# Patient Record
Sex: Female | Born: 1985 | Hispanic: No | Marital: Married | Smoking: Never smoker
Health system: Southern US, Community
[De-identification: ages and names within clinical notes are randomized; demographics above are authoritative.]

## PROBLEM LIST (undated history)

## (undated) ENCOUNTER — Inpatient Hospital Stay (HOSPITAL_COMMUNITY): Payer: Self-pay

## (undated) DIAGNOSIS — Z789 Other specified health status: Secondary | ICD-10-CM

## (undated) HISTORY — PX: NO PAST SURGERIES: SHX2092

---

## 2015-08-27 NOTE — L&D Delivery Note (Signed)
Delivery Note At 5:32 AM a viable and healthy female was delivered via Vaginal, Spontaneous Delivery (Presentation: Right Occiput Anterior).  APGAR: 9, 9; weight pending  .   Placenta status: Intact, Spontaneous.  Cord: 3 vessels with the following complications: None.    Anesthesia: None  Episiotomy: None Lacerations: 2nd degree;Perineal Suture Repair: 3.0 monocryl Est. Blood Loss (mL):  300  Mom to postpartum.  Baby to Couplet care / Skin to Skin.  Tonya [name] is a 30 y.o. female [phone] with IUP at 842w6d admitted for IOL for postdates.  She progressed quickly after start of Pitocin to complete and pushed less than 5 minutes to deliver.  Cord clamping delayed by several minutes then clamped by CNM and cut by FOB.  Placenta intact and spontaneous, bleeding minimal.  Second degree laceration repaired without difficulty.  Mom and baby stable prior to transfer to postpartum. She plans on breastfeeding.     [name], Tonya Charters 12/31/2015, 5:53 AM

## 2015-09-24 ENCOUNTER — Inpatient Hospital Stay (HOSPITAL_COMMUNITY)
Admission: AD | Admit: 2015-09-24 | Discharge: 2015-09-24 | Disposition: A | Discharge status: Home / Self Care | Payer: Medicaid Other | Source: Ambulatory Visit | Attending: Obstetrics & Gynecology | Admitting: Obstetrics & Gynecology

## 2015-09-24 ENCOUNTER — Encounter (HOSPITAL_COMMUNITY): Payer: Self-pay | Admitting: *Deleted

## 2015-09-24 DIAGNOSIS — R109 Unspecified abdominal pain: Secondary | ICD-10-CM

## 2015-09-24 DIAGNOSIS — O26892 Other specified pregnancy related conditions, second trimester: Secondary | ICD-10-CM | POA: Diagnosis not present

## 2015-09-24 DIAGNOSIS — R1011 Right upper quadrant pain: Secondary | ICD-10-CM | POA: Diagnosis present

## 2015-09-24 DIAGNOSIS — Z3A26 26 weeks gestation of pregnancy: Secondary | ICD-10-CM | POA: Diagnosis not present

## 2015-09-24 DIAGNOSIS — O26899 Other specified pregnancy related conditions, unspecified trimester: Secondary | ICD-10-CM

## 2015-09-24 LAB — COMPREHENSIVE METABOLIC PANEL
ALT: 10 U/L — ABNORMAL LOW (ref 14–54)
AST: 17 U/L (ref 15–41)
Albumin: 2.9 g/dL — ABNORMAL LOW (ref 3.5–5.0)
Alkaline Phosphatase: 76 U/L (ref 38–126)
Anion gap: 7 (ref 5–15)
BILIRUBIN TOTAL: 0.6 mg/dL (ref 0.3–1.2)
BUN: 10 mg/dL (ref 6–20)
CO2: 25 mmol/L (ref 22–32)
Calcium: 8.6 mg/dL — ABNORMAL LOW (ref 8.9–10.3)
Chloride: 105 mmol/L (ref 101–111)
Creatinine, Ser: 0.67 mg/dL (ref 0.44–1.00)
Glucose, Bld: 79 mg/dL (ref 65–99)
POTASSIUM: 3.5 mmol/L (ref 3.5–5.1)
Sodium: 137 mmol/L (ref 135–145)
TOTAL PROTEIN: 6.2 g/dL — AB (ref 6.5–8.1)

## 2015-09-24 LAB — URINALYSIS, ROUTINE W REFLEX MICROSCOPIC
BILIRUBIN URINE: NEGATIVE
Glucose, UA: NEGATIVE mg/dL
Hgb urine dipstick: NEGATIVE
Ketones, ur: 15 mg/dL — AB
LEUKOCYTES UA: NEGATIVE
NITRITE: NEGATIVE
PH: 6 (ref 5.0–8.0)
Protein, ur: NEGATIVE mg/dL
SPECIFIC GRAVITY, URINE: 1.02 (ref 1.005–1.030)

## 2015-09-24 LAB — PROTEIN / CREATININE RATIO, URINE
CREATININE, URINE: 193 mg/dL
Protein Creatinine Ratio: 0.09 mg/mg{Cre} (ref 0.00–0.15)
Total Protein, Urine: 17 mg/dL

## 2015-09-24 LAB — CBC
HEMATOCRIT: 28.6 % — AB (ref 36.0–46.0)
Hemoglobin: 9.5 g/dL — ABNORMAL LOW (ref 12.0–15.0)
MCH: 27.6 pg (ref 26.0–34.0)
MCHC: 33.2 g/dL (ref 30.0–36.0)
MCV: 83.1 fL (ref 78.0–100.0)
Platelets: 199 10*3/uL (ref 150–400)
RBC: 3.44 MIL/uL — ABNORMAL LOW (ref 3.87–5.11)
RDW: 13.3 % (ref 11.5–15.5)
WBC: 4.9 10*3/uL (ref 4.0–10.5)

## 2015-09-24 NOTE — MAU Note (Signed)
Pt arrived in the [place] on the 24th from [place] 5 days ago. Pt reports pain in ride upper quad. For 2-3 weeks. Pt worse since yesterday. Pt states that she is 6 months pregnant and given a due date of May 3rd when she was in her country. Pt desires a pregnancy verification letter today.

## 2015-09-24 NOTE — MAU Provider Note (Signed)
  History     CSN: [id number]  Arrival date and time: 09/24/15 2027   First Provider Initiated Contact with Patient 09/24/15 2153      CC: RUQ pain and itching   HPI  Patient is 30 y.o. [id number] [redacted]w[redacted]d reporting RUQ pain and itching.  Pain began last week, acutely worse last night and today.  Described as deep pain, not sharp in quality.  Also itching on legs, thighs and arms.  Did not have this problem with prior pregnancies.  No h/o HTN or gestational HTN. Denies VB, LOF, CTX.  Endorses good fetal movement. Denies blurry vision, headaches, peripheral edema.   OB History    Gravida Para Term Preterm AB TAB SAB Ectopic Multiple Living   History reviewed. No pertinent past medical history.  Past Surgical History  Procedure Laterality Date  . No past surgeries      No family history on file.  Social History  Substance Use Topics  . Smoking status: Never Smoker   . Smokeless tobacco: None  . Alcohol Use: No    Allergies: No Known Allergies  No prescriptions prior to admission    ROS  ROS General: no fevers, chills Eye: no vision changes HENT: no rhinorrhea, sore throat, ear pain CV: no chest pain, palpitations Lung: no SOB, cough GI: no N/V/D, +RUQ abd pain  GU: no VB, LOF, dysuria Skin: +itching  Neuro: no headache, seizure  Psych: no depression, anxiety   Physical Exam   Blood pressure 109/59, pulse 78, temperature 97.9 F (36.6 C), resp. rate 18, height  (1.6 m), weight 75.116 kg (165 lb 9.6 oz), last menstrual period 03/20/2015.  Physical Exam  General: NAD, well appearing pregnant female  Eye: PERRL, EOMI, no icterus  HENT: [place], AT, normal pharynx  CV: RRR, no murmurs Lung: CTAB GI: gravid, uterus above umbilicus, soft, RUQ ttp  Skin: excoriations on arms BL, no jaundice Neuro: AAOx4, no focal deficits Psych: normal mood/affect    MAU Course  Procedures  #FWB: Category 1 FHR 140s/good variability/+ accels/ no decels,  reactive   U/A clean except +ketones Pr/Cr ratio 0.09  CMP wnl including LFTs  CBC hgb 9.5, ptl wnl     Assessment and Plan  Patient is 30 y.o. [id number] [redacted]w[redacted]d reporting RUQ pain and itching likely secondary to possible cholestasis of pregnancy.  Clinically not HELLP given normal labs above.    - may try benadryl for itching - labor precautions dicussed - fetal kick counts reinforced - Handout given - Follow-up with OB provider - need to establish care   Dispo: discharge home  Wynne Dust, MD, PGY-1    Amber Heckart 09/24/2015, 9:53 PM

## 2015-09-24 NOTE — Discharge Instructions (Signed)

## 2015-10-16 ENCOUNTER — Encounter: Payer: Self-pay | Admitting: Family Medicine

## 2015-10-16 ENCOUNTER — Other Ambulatory Visit (HOSPITAL_COMMUNITY)
Admission: RE | Admit: 2015-10-16 | Discharge: 2015-10-16 | Disposition: A | Discharge status: Home / Self Care | Payer: Medicaid Other | Source: Ambulatory Visit | Attending: Family Medicine | Admitting: Family Medicine

## 2015-10-16 ENCOUNTER — Ambulatory Visit (INDEPENDENT_AMBULATORY_CARE_PROVIDER_SITE_OTHER): Payer: Medicaid Other | Admitting: Family Medicine

## 2015-10-16 VITALS — BP 125/53 | HR 80 | Temp 98.5°F | Wt 162.5 lb

## 2015-10-16 DIAGNOSIS — Z01419 Encounter for gynecological examination (general) (routine) without abnormal findings: Secondary | ICD-10-CM | POA: Diagnosis present

## 2015-10-16 DIAGNOSIS — Z113 Encounter for screening for infections with a predominantly sexual mode of transmission: Secondary | ICD-10-CM

## 2015-10-16 DIAGNOSIS — Z789 Other specified health status: Secondary | ICD-10-CM | POA: Diagnosis not present

## 2015-10-16 DIAGNOSIS — Z3490 Encounter for supervision of normal pregnancy, unspecified, unspecified trimester: Secondary | ICD-10-CM | POA: Insufficient documentation

## 2015-10-16 DIAGNOSIS — Z1151 Encounter for screening for human papillomavirus (HPV): Secondary | ICD-10-CM

## 2015-10-16 DIAGNOSIS — Z23 Encounter for immunization: Secondary | ICD-10-CM | POA: Diagnosis not present

## 2015-10-16 DIAGNOSIS — O0933 Supervision of pregnancy with insufficient antenatal care, third trimester: Secondary | ICD-10-CM

## 2015-10-16 DIAGNOSIS — Z124 Encounter for screening for malignant neoplasm of cervix: Secondary | ICD-10-CM

## 2015-10-16 DIAGNOSIS — Z3493 Encounter for supervision of normal pregnancy, unspecified, third trimester: Secondary | ICD-10-CM | POA: Diagnosis not present

## 2015-10-16 DIAGNOSIS — O0930 Supervision of pregnancy with insufficient antenatal care, unspecified trimester: Secondary | ICD-10-CM | POA: Insufficient documentation

## 2015-10-16 LAB — POCT URINALYSIS DIP (DEVICE)
BILIRUBIN URINE: NEGATIVE
Glucose, UA: NEGATIVE mg/dL
Hgb urine dipstick: NEGATIVE
KETONES UR: NEGATIVE mg/dL
Leukocytes, UA: NEGATIVE
NITRITE: NEGATIVE
PH: 5 (ref 5.0–8.0)
PROTEIN: NEGATIVE mg/dL
Specific Gravity, Urine: 1.025 (ref 1.005–1.030)
Urobilinogen, UA: 0.2 mg/dL (ref 0.0–1.0)

## 2015-10-16 MED ORDER — PRENATAL VITAMINS PLUS 27-1 MG PO TABS: 1.0000 | ORAL_TABLET | Freq: Once | ORAL | Status: DC

## 2015-10-16 NOTE — Progress Notes (Signed)
   Subjective:    Tonya [name] is a [id number] [redacted]w[redacted]d being seen today for her first obstetrical visit.  Her obstetrical history is significant for late to care. Patient does intend to breast feed. Pregnancy history fully reviewed.  Patient reports no complaints.  Filed Vitals:   10/16/15 0918  BP: 125/53  Pulse: 80  Temp: 98.5 F (36.9 C)  Weight: 162 lb 8 oz (73.71 kg)    HISTORY: OB History  Gravida Para Term Preterm AB SAB TAB Ectopic Multiple Living  # Outcome Date GA Lbr Len/2nd Weight Sex Delivery Anes PTL Lv  4 Current           3 Term     M Vag-Spont None N   2 Term     M Vag-Spont None N Y  1 Term     M Vag-Spont None N Y     History reviewed. No pertinent past medical history. Past Surgical History  Procedure Laterality Date  . No past surgeries     History reviewed. No pertinent family history.   Exam    Uterus:   30 wk size  Pelvic Exam:    Perineum: Normal Perineum   Vulva: Bartholin's, Urethra, Skene's normal   Vagina:  normal mucosa, normal discharge   Cervix: multiparous appearance and no bleeding following Pap   Adnexa: normal adnexa and no mass, fullness, tenderness   Bony Pelvis: average  System: Breast:  normal appearance, no masses or tenderness   Skin: normal coloration and turgor, no rashes    Neurologic: normal   Extremities: normal strength, tone, and muscle mass   HEENT extra ocular movement intact and sclera clear, anicteric   Mouth/Teeth mucous membranes moist, pharynx normal without lesions and dental hygiene good   Neck supple   Cardiovascular: regular rate and rhythm, I/VI soft blowing holosystolic murmur   Respiratory:  appears well, vitals normal, no respiratory distress, acyanotic, normal RR, ear and throat exam is normal, neck free of mass or lymphadenopathy, chest clear, no wheezing, crepitations, rhonchi, normal symmetric air entry   Abdomen: soft, non-tender; bowel sounds normal; no masses,  no organomegaly       Assessment/Plan:  1. Supervision of normal pregnancy, third trimester  - Culture, OB Urine - Prenatal Profile - Hemoglobinopathy evaluation - Prescript Monitor Profile(19) - Cytology - PAP - [place] MFM OB COMP + 14 WK; Future - Cystic fibrosis diagnostic study - Glucose Tolerance, 1 HR (50g)  2. Late prenatal care affecting pregnancy, antepartum, third trimester Too late for genetic screen   Renley Gutman S 10/16/2015

## 2015-10-16 NOTE — Patient Instructions (Addendum)
Naas-nuujinta Go'aansiga naaska waa mid ka mid ah xulashada ugu fiican ee aad samayn kartaa adiga iyo ilmahaaga. Isbeddel ku hormoonnada [place] sababa unugyada naaska si aad u koraan iyo kordhiyaa tirada iyo size of tuubada caanaha aad. hormoonnada waxa kale oo u oggolaan borotiinada, sonkorta, iyo dufan ka qulqulka dhiigga in la sameeyo caanaha naaska ee qanjidhada caanaha soo saara. Hormoonnada hortago caanaha naaska ka la sii daayey ka hor inta ilmahaagu dhasho iyo sidoo kale socodka caanaha isla markiiba dhalashada ka dib. Marka naasnuujinta ayaa bilaabay, waana fikirro ilmahaaga, iyo sidoo kale isaga ama iyada caanonuug ama qaylinaya, kicin kara in la sii daayo oo caano ah ka qanjirada caanaha soo saara. [place], Your Baby caanaha Your ugu horeysay (dambar) ka caawisaa habka shaqo dheefshiidka ilmahaaga wanaagsan. Waxaa jira antibodies in caanaha aad ka caawiya ilmahaaga dagaalamo cudurada. Ilmahaagu waxa uu leeyahay dhacdooyinka a hoose ee neefta, xasaasiyad, iyo lama filaan ah dhimashada dhallaanka syndrome. The nafaqooyinka caanaha naaska waa wanaagsan ee ilmahaaga ka qaaciidooyinka dhallaanka iyo waxaa si gaar ah loogu talagalay baahida ilmahaaga. Caanaha naaska hagaajinaysaa [employer]. Ilmahaagu waa yar tahay in ay yeeshaan xaaladaha kale, sida cayilka carruurnimada, neefta, ama nooca 2 mellitus diabetes. Waayo, waxaad Naas-nuujinta waxay ka caawisaa in la abuuro bond aad u gaarka ah ee u dhexeeya adiga iyo ilmahaaga. Naasnuujintu waa ku haboon. Caanaha naaska had iyo jeer waxaa laga heli karaa heerkulka saxda ah iyo [place]. Naas-nuujinta waxay ka caawisaa si ay u gubaan calories oo kaa caawinaysaa in aad lumiso miisaanka helay [place] lagu guda Francoise Ceo. Naas ka dhigaysa heshiiska ilmo galeenka si aad u size prepregnancy ay si dhakhso ah oo loogu talla dhiig (uub) ka dib umusha. Naas-nuujinta ayaa kaa yareyn kara halista nooca  2aad ee diabetes mellitus, osteoporosis, iyo naaska ama kansarka ugxansiduhu dambe ee nolosha. CALAAMADAHA IN [facility] WAA GAAJADA Cal[place]ad[place] Hore ee Gaajada feejignaanta la kordhiyo ama waxqabad. [place]. Movement of madaxa dhinac ilaa dhinac. Movement of madaxa iyo furitaanka afka Luciana Axe of afka ama dhabanka waxaa stroked (afjarno). Kordhinta jaqaan dhawaaqa, dibnaha, muusiqada, taahid, ama xooga. Gacan-ka-afka [id number]. caanonuug korodha faraha ama gacmaha. Calaamadaha dambe ee Gaajada Fussing. Ka dhex qaylinaya. Ba'an [place] Gaajada [place] gaajo xad dhaaf ah u baahan doonaa dejinaaya oo u tacsiyaynaysay hor ilmahaaga awoodaan in ay si guul leh u naasnuujin noqon doonaa. Ha sugin calaamadaha soo socda gaajo xad dhaaf ah ay u dhacaan ka hor inta aadan la bilaabo naas-nuujinta: Degenaansho la'aanta. A cod weyn, qaylada xoogga leh. Qeyliyo. AASAAS NUUJINTA Naas Bilaabayo Raadi meel raaxo leh in fadhiiso ama jiifso, oo aad qoorta iyo dhabarka si fiican u taageeray. Dhig barkimo ama buste u [place] hoos ilmahaaga in ay isaga ama iyada ku soo dejin heerka naaska (haddii aad ka fadhiisanayso). barkimo Nursing waxaa si gaar ah loogu talagalay in lagu caawiyo gacmahaaga iyo ilmahaaga taageero [employer]. Hubi in caloosha ilmaha waxaa soo food saartay calooshaada. Qunyar salaax naasahaaga. Iyadoo farahaaga, massage ka derbiga xabadka aad xagga ibta dhaqaaq wareeg ah. Tani waxay dhiiri socodka caanaha. Waxaa laga yaabaa inaad u baahan tahay in ay sii wadaan tallaabadan lagu jiro quudinta haddii caanaha si tartiib ah u baxayaan. Taageer naaska la 4 faraha hoose iyo suulka aad kor ku ibta. Hubi farahaaga yihiin iyo sidoo kale ka ibta naaska oo afka ilmahaaga iska. Stroke bushimaha ilmahaaga si tartiib ah oo aad farta ama ibta. Marka ilmahaaga afkiisu waa u furan ballaaran ku filan, si deg deg ah si aad u soo qaado ilmahaaga naaska, gelinayn ibta oo idil iyo sida badan ee  aagga midab agagaarka ibta (ibta) intii suurto gal ah ee ilmahaaga afkayga ma gelin. ibta More waa in ay la arki karo ka sareeyaan dibinta sare ee ilmahaaga ka hoos dibinta hoose. carrabka ilmahaagu waa inuu u dhexeeyo ama xanjo iyada hoose iyo naaska. Hubi in ilmahaaga afkiisa si sax ah u taagan hareeraha ibta (shabaqa). bushimihiisa ilmahaaga waa in la abuuro seal a on naaska iyo dibadda loo soo jeestay (everted). Waa caadi in ilmahaaga nuugo oo ku saabsan 2-3 daqiiqo si ay u bilaabaan socodka caanaha naaska. Johney [place] Baridda ilmahaaga sida loo qabsado on inay naaska si sax ah waa mid aad u muhiim ah. xire An khaldan waxay Lanetta Inch xanuun ibta oo hoos u siin caanaha aad iyo miisaanka masaakiinta faa'iido ee ilmahaaga. [place] kale, haddii [employer] rogtid oo aad ibta naaska si sax ah, isaga ama iyada waxaa laga yaabaa in la gooyo qaar ka mid ah hawada lagu jiro quudinta. Murrell Redden waxay ka dhigi kartaa ilmahaagu ooyo. Daacsiinta ilmahaaga marka aad beddelaan naasahaaga inta lagu jiro quudinta ayaa kaa caawin kara in laga takhaluso hawada. Si kastaba ha ahaatee, wax ku barayay in ilmahaagu qabsado on si sax ah weli waa habka ugu fiican si looga hortago jeclayn ka liqidda hawada halka naas nuujinta. Calaamadaha in ilmahaaga ayaa si guul leh shabaqa on inay ibta: tugging Silent ama caanonuug aamusa, oo aan aad u [place] xanuun. Liqidda [employer] u dhaxaysa 3-4 Rogue Jury. dhaqdhaqaaqa Murqo kor ku xusan oo hore ee isaga ama iyada dhegaha halka nuugo. Calaamadaha in ilmahaaga ayaa si guul ah ma shabaqa on in ibta naaska: Nuugista dhawaaqa ama dilin dhawaaqyada ka soo ilmahaaga halka naas nuujinta. xanuunka ibta. Haddii aad u malaynayso in [place] uusan shabaqa ku sax, simbiriirixan fartaada galay geeska of ilmahaaga

## 2015-10-16 NOTE — Progress Notes (Signed)
Nutrition note: 1st visit consult Pt has gained 4.5# @ 30w, which is < expected. Pt reports eating 3 meals & 2-3 snacks/d. Pt is taking a PNV. Pt reports no N&V but has some heartburn. NKFA. Pt received verbal & written education on general nutrition during pregnancy via an interpreter. Encouraged protein foods with all meals & snacks. Discussed wt gain goals of 11-20# or 0.5#/wk. Pt agrees to eat a protein source with each meal & snack. Pt has WIC & plans to BF. F/u as needed Blondell Reveal, MS, RD, LDN, [facility]

## 2015-10-17 ENCOUNTER — Ambulatory Visit (HOSPITAL_COMMUNITY)
Admission: RE | Admit: 2015-10-17 | Discharge: 2015-10-17 | Disposition: A | Discharge status: Home / Self Care | Payer: Medicaid Other | Source: Ambulatory Visit | Attending: Family Medicine | Admitting: Family Medicine

## 2015-10-17 DIAGNOSIS — Z3A30 30 weeks gestation of pregnancy: Secondary | ICD-10-CM | POA: Diagnosis not present

## 2015-10-17 DIAGNOSIS — Z36 Encounter for antenatal screening of mother: Secondary | ICD-10-CM | POA: Diagnosis not present

## 2015-10-17 DIAGNOSIS — Z3493 Encounter for supervision of normal pregnancy, unspecified, third trimester: Secondary | ICD-10-CM

## 2015-10-17 LAB — PRENATAL PROFILE (SOLSTAS)
ANTIBODY SCREEN: NEGATIVE
BASOS ABS: 0 10*3/uL (ref 0.0–0.1)
BASOS PCT: 0 % (ref 0–1)
EOS ABS: 0.2 10*3/uL (ref 0.0–0.7)
Eosinophils Relative: 4 % (ref 0–5)
HEMATOCRIT: 29.6 % — AB (ref 36.0–46.0)
HEMOGLOBIN: 9.9 g/dL — AB (ref 12.0–15.0)
HEP B S AG: NEGATIVE
HIV 1&2 Ab, 4th Generation: NONREACTIVE
Lymphocytes Relative: 17 % (ref 12–46)
Lymphs Abs: 0.9 10*3/uL (ref 0.7–4.0)
MCH: 27 pg (ref 26.0–34.0)
MCHC: 33.4 g/dL (ref 30.0–36.0)
MCV: 80.7 fL (ref 78.0–100.0)
MONOS PCT: 7 % (ref 3–12)
MPV: 11 fL (ref 8.6–12.4)
Monocytes Absolute: 0.4 10*3/uL (ref 0.1–1.0)
NEUTROS ABS: 3.8 10*3/uL (ref 1.7–7.7)
NEUTROS PCT: 72 % (ref 43–77)
Platelets: 241 10*3/uL (ref 150–400)
RBC: 3.67 MIL/uL — AB (ref 3.87–5.11)
RDW: 13.6 % (ref 11.5–15.5)
Rh Type: POSITIVE
Rubella: 2 Index — ABNORMAL HIGH (ref <0.90)
WBC: 5.3 10*3/uL (ref 4.0–10.5)

## 2015-10-17 LAB — CYSTIC FIBROSIS DIAGNOSTIC STUDY

## 2015-10-17 LAB — GLUCOSE TOLERANCE, 1 HOUR (50G) W/O FASTING: GLUCOSE, 1 HR, GESTATIONAL: 86 mg/dL (ref <140)

## 2015-10-17 NOTE — Addendum Note (Signed)
Encounter addended by: Reva Bores, MD on: 10/17/2015  5:06 PM<BR>     Documentation filed: Problem List

## 2015-10-18 LAB — CULTURE, OB URINE

## 2015-10-18 LAB — HEMOGLOBINOPATHY EVALUATION
HEMOGLOBIN OTHER: 0 %
HGB S QUANTITAION: 0 %
Hgb A2 Quant: 2.9 % (ref 2.2–3.2)
Hgb A: 97.1 % (ref 96.8–97.8)
Hgb F Quant: 0 % (ref 0.0–2.0)

## 2015-10-18 LAB — PRESCRIPTION MONITORING PROFILE (19 PANEL)
AMPHETAMINE/METH: NEGATIVE ng/mL
BARBITURATE SCREEN, URINE: NEGATIVE ng/mL
BUPRENORPHINE, URINE: NEGATIVE ng/mL
Benzodiazepine Screen, Urine: NEGATIVE ng/mL
CANNABINOID SCRN UR: NEGATIVE ng/mL
CARISOPRODOL, URINE: NEGATIVE ng/mL
Cocaine Metabolites: NEGATIVE ng/mL
Creatinine, Urine: 160.08 mg/dL (ref >20.0)
Fentanyl, Ur: NEGATIVE ng/mL
MDMA URINE: NEGATIVE ng/mL
METHAQUALONE SCREEN (URINE): NEGATIVE ng/mL
Meperidine, Ur: NEGATIVE ng/mL
Methadone Screen, Urine: NEGATIVE ng/mL
NITRITES URINE, INITIAL: NEGATIVE ug/mL
OPIATE SCREEN, URINE: NEGATIVE ng/mL
Oxycodone Screen, Ur: NEGATIVE ng/mL
PHENCYCLIDINE, UR: NEGATIVE ng/mL
Propoxyphene: NEGATIVE ng/mL
TAPENTADOLUR: NEGATIVE ng/mL
Tramadol Scrn, Ur: NEGATIVE ng/mL
Zolpidem, Urine: NEGATIVE ng/mL
pH, Initial: 5.4 pH (ref 4.5–8.9)

## 2015-10-18 LAB — CYTOLOGY - PAP

## 2015-10-30 ENCOUNTER — Ambulatory Visit (INDEPENDENT_AMBULATORY_CARE_PROVIDER_SITE_OTHER): Payer: Medicaid Other | Admitting: Family Medicine

## 2015-10-30 VITALS — BP 116/66 | HR 84 | Temp 98.3°F | Wt 163.1 lb

## 2015-10-30 DIAGNOSIS — H6241 Otitis externa in other diseases classified elsewhere, right ear: Secondary | ICD-10-CM

## 2015-10-30 DIAGNOSIS — Z36 Encounter for antenatal screening of mother: Secondary | ICD-10-CM

## 2015-10-30 DIAGNOSIS — B369 Superficial mycosis, unspecified: Secondary | ICD-10-CM

## 2015-10-30 DIAGNOSIS — Z3493 Encounter for supervision of normal pregnancy, unspecified, third trimester: Secondary | ICD-10-CM

## 2015-10-30 DIAGNOSIS — Z1389 Encounter for screening for other disorder: Secondary | ICD-10-CM

## 2015-10-30 DIAGNOSIS — H628X1 Other disorders of right external ear in diseases classified elsewhere: Secondary | ICD-10-CM

## 2015-10-30 DIAGNOSIS — O0933 Supervision of pregnancy with insufficient antenatal care, third trimester: Secondary | ICD-10-CM

## 2015-10-30 DIAGNOSIS — B368 Other specified superficial mycoses: Secondary | ICD-10-CM

## 2015-10-30 LAB — POCT URINALYSIS DIP (DEVICE)
GLUCOSE, UA: NEGATIVE mg/dL
Hgb urine dipstick: NEGATIVE
NITRITE: NEGATIVE
PH: 5 (ref 5.0–8.0)
PROTEIN: 30 mg/dL — AB
Specific Gravity, Urine: 1.025 (ref 1.005–1.030)
Urobilinogen, UA: 0.2 mg/dL (ref 0.0–1.0)

## 2015-10-30 MED ORDER — CLOTRIMAZOLE 1 % EX SOLN: 1.0000 "application " | Freq: Two times a day (BID) | CUTANEOUS | Status: DC

## 2015-10-30 NOTE — Progress Notes (Signed)
U/S scheduled for 11/14/2015 @10 :00am

## 2015-10-30 NOTE — Patient Instructions (Signed)
Breastfeeding Deciding to breastfeed is one of the best choices you can make for you and your baby. A change in hormones during pregnancy causes your breast tissue to grow and increases the number and size of your milk ducts. These hormones also allow proteins, sugars, and fats from your blood supply to make breast milk in your milk-producing glands. Hormones prevent breast milk from being released before your baby is born as well as prompt milk flow after birth. Once breastfeeding has begun, thoughts of your baby, as well as his or her sucking or crying, can stimulate the release of milk from your milk-producing glands.  BENEFITS OF BREASTFEEDING For Your Baby  Your first milk (colostrum) helps your baby's digestive system function better.  There are antibodies in your milk that help your baby fight off infections.  Your baby has a lower incidence of asthma, allergies, and sudden infant death syndrome.  The nutrients in breast milk are better for your baby than infant formulas and are designed uniquely for your baby's needs.  Breast milk improves your baby's brain development.  Your baby is less likely to develop other conditions, such as childhood obesity, asthma, or type 2 diabetes mellitus. For You  Breastfeeding helps to create a very special bond between you and your baby.  Breastfeeding is convenient. Breast milk is always available at the correct temperature and costs nothing.  Breastfeeding helps to burn calories and helps you lose the weight gained during pregnancy.  Breastfeeding makes your uterus contract to its prepregnancy size faster and slows bleeding (lochia) after you give birth.   Breastfeeding helps to lower your risk of developing type 2 diabetes mellitus, osteoporosis, and breast or ovarian cancer later in life. SIGNS THAT YOUR BABY IS HUNGRY Early Signs of Hunger  Increased alertness or activity.  Stretching.  Movement of the head from side to  side.  Movement of the head and opening of the mouth when the corner of the mouth or cheek is stroked (rooting).  Increased sucking sounds, smacking lips, cooing, sighing, or squeaking.  Hand-to-mouth movements.  Increased sucking of fingers or hands. Late Signs of Hunger  Fussing.  Intermittent crying. Extreme Signs of Hunger Signs of extreme hunger will require calming and consoling before your baby will be able to breastfeed successfully. Do not wait for the following signs of extreme hunger to occur before you initiate breastfeeding:  Restlessness.  A loud, strong cry.  Screaming. BREASTFEEDING BASICS Breastfeeding Initiation  Find a comfortable place to sit or lie down, with your neck and back well supported.  Place a pillow or rolled up blanket under your baby to bring him or her to the level of your breast (if you are seated). Nursing pillows are specially designed to help support your arms and your baby while you breastfeed.  Make sure that your baby's abdomen is facing your abdomen.  Gently massage your breast. With your fingertips, massage from your chest wall toward your nipple in a circular motion. This encourages milk flow. You may need to continue this action during the feeding if your milk flows slowly.  Support your breast with 4 fingers underneath and your thumb above your nipple. Make sure your fingers are well away from your nipple and your baby's mouth.  Stroke your baby's lips gently with your finger or nipple.  When your baby's mouth is open wide enough, quickly bring your baby to your breast, placing your entire nipple and as much of the colored area around your nipple (  areola) as possible into your baby's mouth.  More areola should be visible above your baby's upper lip than below the lower lip.  Your baby's tongue should be between his or her lower gum and your breast.  Ensure that your baby's mouth is correctly positioned around your nipple  (latched). Your baby's lips should create a seal on your breast and be turned out (everted).  It is common for your baby to suck about 2-3 minutes in order to start the flow of breast milk. Latching Teaching your baby how to latch on to your breast properly is very important. An improper latch can cause nipple pain and decreased milk supply for you and poor weight gain in your baby. Also, if your baby is not latched onto your nipple properly, he or she may swallow some air during feeding. This can make your baby fussy. Burping your baby when you switch breasts during the feeding can help to get rid of the air. However, teaching your baby to latch on properly is still the best way to prevent fussiness from swallowing air while breastfeeding. Signs that your baby has successfully latched on to your nipple:  Silent tugging or silent sucking, without causing you pain.  Swallowing heard between every 3-4 sucks.  Muscle movement above and in front of his or her ears while sucking. Signs that your baby has not successfully latched on to nipple:  Sucking sounds or smacking sounds from your baby while breastfeeding.  Nipple pain. If you think your baby has not latched on correctly, slip your finger into the corner of your baby's mouth to break the suction and place it between your baby's gums. Attempt breastfeeding initiation again. Signs of Successful Breastfeeding Signs from your baby:  A gradual decrease in the number of sucks or complete cessation of sucking.  Falling asleep.  Relaxation of his or her body.  Retention of a small amount of milk in his or her mouth.  Letting go of your breast by himself or herself. Signs from you:  Breasts that have increased in firmness, weight, and size 1-3 hours after feeding.  Breasts that are softer immediately after breastfeeding.  Increased milk volume, as well as a change in milk consistency and color by the fifth day of breastfeeding.  Nipples  that are not sore, cracked, or bleeding. Signs That Your Baby is Getting Enough Milk  Wetting at least 3 diapers in a 24-hour period. The urine should be clear and pale yellow by age 5 days.  At least 3 stools in a 24-hour period by age 5 days. The stool should be soft and yellow.  At least 3 stools in a 24-hour period by age 7 days. The stool should be seedy and yellow.  No loss of weight greater than 10% of birth weight during the first 3 days of age.  Average weight gain of 4-7 ounces (113-198 g) per week after age 4 days.  Consistent daily weight gain by age 5 days, without weight loss after the age of 2 weeks. After a feeding, your baby may spit up a small amount. This is common. BREASTFEEDING FREQUENCY AND DURATION Frequent feeding will help you make more milk and can prevent sore nipples and breast engorgement. Breastfeed when you feel the need to reduce the fullness of your breasts or when your baby shows signs of hunger. This is called "breastfeeding on demand." Avoid introducing a pacifier to your baby while you are working to establish breastfeeding (the first 4-6 weeks   after your baby is born). After this time you may choose to use a pacifier. Research has shown that pacifier use during the first year of a baby's life decreases the risk of sudden infant death syndrome (SIDS). Allow your baby to feed on each breast as long as he or she wants. Breastfeed until your baby is finished feeding. When your baby unlatches or falls asleep while feeding from the first breast, offer the second breast. Because newborns are often sleepy in the first few weeks of life, you may need to awaken your baby to get him or her to feed. Breastfeeding times will vary from baby to baby. However, the following rules can serve as a guide to help you ensure that your baby is properly fed:  Newborns (babies 4 weeks of age or younger) may breastfeed every 1-3 hours.  Newborns should not go longer than 3 hours  during the day or 5 hours during the night without breastfeeding.  You should breastfeed your baby a minimum of 8 times in a 24-hour period until you begin to introduce solid foods to your baby at around 6 months of age. BREAST MILK PUMPING Pumping and storing breast milk allows you to ensure that your baby is exclusively fed your breast milk, even at times when you are unable to breastfeed. This is especially important if you are going back to work while you are still breastfeeding or when you are not able to be present during feedings. Your lactation consultant can give you guidelines on how long it is safe to store breast milk. A breast pump is a machine that allows you to pump milk from your breast into a sterile bottle. The pumped breast milk can then be stored in a refrigerator or freezer. Some breast pumps are operated by hand, while others use electricity. Ask your lactation consultant which type will work best for you. Breast pumps can be purchased, but some hospitals and breastfeeding support groups lease breast pumps on a monthly basis. A lactation consultant can teach you how to hand express breast milk, if you prefer not to use a pump. CARING FOR YOUR BREASTS WHILE YOU BREASTFEED Nipples can become dry, cracked, and sore while breastfeeding. The following recommendations can help keep your breasts moisturized and healthy:  Avoid using soap on your nipples.  Wear a supportive bra. Although not required, special nursing bras and tank tops are designed to allow access to your breasts for breastfeeding without taking off your entire bra or top. Avoid wearing underwire-style bras or extremely tight bras.  Air dry your nipples for 3-4minutes after each feeding.  Use only cotton bra pads to absorb leaked breast milk. Leaking of breast milk between feedings is normal.  Use lanolin on your nipples after breastfeeding. Lanolin helps to maintain your skin's normal moisture barrier. If you use  pure lanolin, you do not need to wash it off before feeding your baby again. Pure lanolin is not toxic to your baby. You may also hand express a few drops of breast milk and gently massage that milk into your nipples and allow the milk to air dry. In the first few weeks after giving birth, some women experience extremely full breasts (engorgement). Engorgement can make your breasts feel heavy, warm, and tender to the touch. Engorgement peaks within 3-5 days after you give birth. The following recommendations can help ease engorgement:  Completely empty your breasts while breastfeeding or pumping. You may want to start by applying warm, moist heat (in   the shower or with warm water-soaked hand towels) just before feeding or pumping. This increases circulation and helps the milk flow. If your baby does not completely empty your breasts while breastfeeding, pump any extra milk after he or she is finished.  Wear a snug bra (nursing or regular) or tank top for 1-2 days to signal your body to slightly decrease milk production.  Apply ice packs to your breasts, unless this is too uncomfortable for you.  Make sure that your baby is latched on and positioned properly while breastfeeding. If engorgement persists after 48 hours of following these recommendations, contact your health care provider or a lactation consultant. OVERALL HEALTH CARE RECOMMENDATIONS WHILE BREASTFEEDING  Eat healthy foods. Alternate between meals and snacks, eating 3 of each per day. Because what you eat affects your breast milk, some of the foods may make your baby more irritable than usual. Avoid eating these foods if you are sure that they are negatively affecting your baby.  Drink milk, fruit juice, and water to satisfy your thirst (about 10 glasses a day).  Rest often, relax, and continue to take your prenatal vitamins to prevent fatigue, stress, and anemia.  Continue breast self-awareness checks.  Avoid chewing and smoking  tobacco. Chemicals from cigarettes that pass into breast milk and exposure to secondhand smoke may harm your baby.  Avoid alcohol and drug use, including marijuana. Some medicines that may be harmful to your baby can pass through breast milk. It is important to ask your health care provider before taking any medicine, including all over-the-counter and prescription medicine as well as vitamin and herbal supplements. It is possible to become pregnant while breastfeeding. If birth control is desired, ask your health care provider about options that will be safe for your baby. SEEK MEDICAL CARE IF:  You feel like you want to stop breastfeeding or have become frustrated with breastfeeding.  You have painful breasts or nipples.  Your nipples are cracked or bleeding.  Your breasts are red, tender, or warm.  You have a swollen area on either breast.  You have a fever or chills.  You have nausea or vomiting.  You have drainage other than breast milk from your nipples.  Your breasts do not become full before feedings by the fifth day after you give birth.  You feel sad and depressed.  Your baby is too sleepy to eat well.  Your baby is having trouble sleeping.   Your baby is wetting less than 3 diapers in a 24-hour period.  Your baby has less than 3 stools in a 24-hour period.  Your baby's skin or the white part of his or her eyes becomes yellow.   Your baby is not gaining weight by 5 days of age. SEEK IMMEDIATE MEDICAL CARE IF:  Your baby is overly tired (lethargic) and does not want to wake up and feed.  Your baby develops an unexplained fever.   This information is not intended to replace advice given to you by your health care provider. Make sure you discuss any questions you have with your health care provider.   Document Released: 08/12/2005 Document Revised: 05/03/2015 Document Reviewed: 02/03/2013 Elsevier Interactive Patient Education 2016 [employer]  

## 2015-10-30 NOTE — Progress Notes (Signed)
Subjective:  Tonya [name] is a 30 y.o. G4P3003 at 5271w0d being seen today for ongoing prenatal care.  She is currently monitored for the following issues for this low-risk pregnancy and has Supervision of normal pregnancy; Late prenatal care affecting pregnancy, antepartum; and Language barrier on her problem list.  Patient reports right ear itching.  Contractions: Not present.  .  Movement: Present. Denies leaking of fluid.   The following portions of the patient's history were reviewed and updated as appropriate: allergies, current medications, past family history, past medical history, past social history, past surgical history and problem list. Problem list updated.  Objective:   Filed Vitals:   10/30/15 0945  BP: 116/66  Pulse: 84  Temp: 98.3 F (36.8 C)  Weight: 163 lb 1.6 oz (73.982 kg)    Fetal Status: Fetal Heart Rate (bpm): 143 Fundal Height: 32 cm Movement: Present     General:  Alert, oriented and cooperative. Patient is in no acute distress.  HEENT: Left EAC WNL, right EAC with hard wax and white spots noted. TM is not completely visualized on this side.  Skin: Skin is warm and dry. No rash noted.   Cardiovascular: Normal heart rate noted  Respiratory: Normal respiratory effort, no problems with respiration noted  Abdomen: Soft, gravid, appropriate for gestational age. Pain/Pressure: Present     Extremities: Normal range of motion.  Edema: None  Mental Status: Normal mood and affect. Normal behavior. Normal judgment and thought content.   Urinalysis: Urine Protein: 1+ Urine Glucose: Negative  Assessment and Plan:  Pregnancy: G4P3003 at 9271w0d  1. Supervision of normal pregnancy, third trimester Continue routine prenatal care.  - US MFM OB FOLLOW UP; Future  2. Late prenatal care affecting pregnancy, antepartum, third trimester   3. Encounter for routine screening for malformation using ultrasonics F/u to complete anatomy - US MFM OB FOLLOW UP; Future  4.  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 To right ear  Dispense: 10 mL; Refill: 0  Preterm labor symptoms and general obstetric precautions including but not limited to vaginal bleeding, contractions, leaking of fluid and fetal movement were reviewed in detail with the patient. Please refer to After Visit Summary for other counseling recommendations.  Return in 2 weeks (on 11/13/2015) [facility]   Reva Boresanya S Porfiria Heinrich, MD

## 2015-11-14 ENCOUNTER — Ambulatory Visit (HOSPITAL_COMMUNITY)
Admission: RE | Admit: 2015-11-14 | Discharge: 2015-11-14 | Disposition: A | Discharge status: Home / Self Care | Payer: Medicaid Other | Source: Ambulatory Visit | Attending: Family Medicine | Admitting: Family Medicine

## 2015-11-14 ENCOUNTER — Other Ambulatory Visit: Payer: Self-pay | Admitting: Family Medicine

## 2015-11-14 ENCOUNTER — Ambulatory Visit (INDEPENDENT_AMBULATORY_CARE_PROVIDER_SITE_OTHER): Payer: Medicaid Other | Admitting: Certified Nurse Midwife

## 2015-11-14 VITALS — BP 118/62 | HR 80 | Wt 163.8 lb

## 2015-11-14 DIAGNOSIS — Z3493 Encounter for supervision of normal pregnancy, unspecified, third trimester: Secondary | ICD-10-CM

## 2015-11-14 DIAGNOSIS — Z36 Encounter for antenatal screening of mother: Secondary | ICD-10-CM | POA: Diagnosis not present

## 2015-11-14 DIAGNOSIS — Z1389 Encounter for screening for other disorder: Secondary | ICD-10-CM

## 2015-11-14 DIAGNOSIS — IMO0002 Reserved for concepts with insufficient information to code with codable children: Secondary | ICD-10-CM

## 2015-11-14 DIAGNOSIS — Z3A34 34 weeks gestation of pregnancy: Secondary | ICD-10-CM

## 2015-11-14 DIAGNOSIS — Z0489 Encounter for examination and observation for other specified reasons: Secondary | ICD-10-CM

## 2015-11-14 DIAGNOSIS — O0933 Supervision of pregnancy with insufficient antenatal care, third trimester: Secondary | ICD-10-CM

## 2015-11-14 DIAGNOSIS — Z363 Encounter for antenatal screening for malformations: Secondary | ICD-10-CM

## 2015-11-14 DIAGNOSIS — Z789 Other specified health status: Secondary | ICD-10-CM | POA: Diagnosis not present

## 2015-11-14 LAB — POCT URINALYSIS DIP (DEVICE)
Bilirubin Urine: NEGATIVE
Glucose, UA: NEGATIVE mg/dL
Hgb urine dipstick: NEGATIVE
Ketones, ur: NEGATIVE mg/dL
Leukocytes, UA: NEGATIVE
Nitrite: NEGATIVE
Protein, ur: NEGATIVE mg/dL
Specific Gravity, Urine: 1.02 (ref 1.005–1.030)
Urobilinogen, UA: 0.2 mg/dL (ref 0.0–1.0)
pH: 5.5 (ref 5.0–8.0)

## 2015-11-14 NOTE — Progress Notes (Signed)
Subjective:  Tonya [name] is a 30 y.o. G4P3003 at 4528w1d being seen today for ongoing prenatal care.  She is currently monitored for the following issues for this low-risk pregnancy and has Supervision of normal pregnancy; Late prenatal care affecting pregnancy, antepartum; and Language barrier on her problem list.  Patient reports no complaints.  Contractions: Not present. Vag. Bleeding: None.  Movement: Present. Denies leaking of fluid.   The following portions of the patient's history were reviewed and updated as appropriate: allergies, current medications, past family history, past medical history, past social history, past surgical history and problem list. Problem list updated.  Objective:   Filed Vitals:   11/14/15 0928  BP: 118/62  Pulse: 80  Weight: 163 lb 12.8 oz (74.299 kg)    Fetal Status: Fetal Heart Rate (bpm): 142   Movement: Present     General:  Alert, oriented and cooperative. Patient is in no acute distress.  Skin: Skin is warm and dry. No rash noted.   Cardiovascular: Normal heart rate noted  Respiratory: Normal respiratory effort, no problems with respiration noted  Abdomen: Soft, gravid, appropriate for gestational age. Pain/Pressure: Present     Pelvic: Vag. Bleeding: None     Cervical exam deferred        Extremities: Normal range of motion.     Mental Status: Normal mood and affect. Normal behavior. Normal judgment and thought content.   Urinalysis: Urine Protein: Negative Urine Glucose: Negative  Assessment and Plan:  Pregnancy: G4P3003 at 5528w1d  1. Language barrier   2. Late prenatal care affecting pregnancy, antepartum, third trimester   3. Supervision of normal pregnancy, third trimester   Preterm labor symptoms and general obstetric precautions including but not limited to vaginal bleeding, contractions, leaking of fluid and fetal movement were reviewed in detail with the patient. Please refer to After Visit Summary for other counseling  recommendations.  No Follow-up on file.   Rhea PinkLori A Clemmons, CNM

## 2015-11-28 ENCOUNTER — Encounter: Payer: Medicaid Other | Admitting: Advanced Practice Midwife

## 2015-12-05 ENCOUNTER — Ambulatory Visit (INDEPENDENT_AMBULATORY_CARE_PROVIDER_SITE_OTHER): Payer: Medicaid Other | Admitting: Family

## 2015-12-05 ENCOUNTER — Other Ambulatory Visit (HOSPITAL_COMMUNITY)
Admission: RE | Admit: 2015-12-05 | Discharge: 2015-12-05 | Disposition: A | Discharge status: Home / Self Care | Payer: Medicaid Other | Source: Ambulatory Visit | Attending: Advanced Practice Midwife | Admitting: Advanced Practice Midwife

## 2015-12-05 VITALS — BP 108/63 | HR 77 | Temp 98.1°F | Wt 164.3 lb

## 2015-12-05 DIAGNOSIS — Z113 Encounter for screening for infections with a predominantly sexual mode of transmission: Secondary | ICD-10-CM | POA: Diagnosis present

## 2015-12-05 DIAGNOSIS — Z3493 Encounter for supervision of normal pregnancy, unspecified, third trimester: Secondary | ICD-10-CM

## 2015-12-05 LAB — POCT URINALYSIS DIP (DEVICE)
Bilirubin Urine: NEGATIVE
GLUCOSE, UA: NEGATIVE mg/dL
Hgb urine dipstick: NEGATIVE
Ketones, ur: NEGATIVE mg/dL
LEUKOCYTES UA: NEGATIVE
NITRITE: NEGATIVE
Protein, ur: 30 mg/dL — AB
UROBILINOGEN UA: 0.2 mg/dL (ref 0.0–1.0)
pH: 5 (ref 5.0–8.0)

## 2015-12-05 LAB — OB RESULTS CONSOLE GBS: STREP GROUP B AG: NEGATIVE

## 2015-12-05 LAB — OB RESULTS CONSOLE GC/CHLAMYDIA: GC PROBE AMP, GENITAL: NEGATIVE

## 2015-12-05 NOTE — Progress Notes (Signed)
Cultures today 

## 2015-12-05 NOTE — Progress Notes (Signed)
Subjective:  Tonya [name] is a 30 y.o. G4P3003 at 8262w1d being seen today for ongoing prenatal care.  She is currently monitored for the following issues for this low-risk pregnancy and has Supervision of normal pregnancy; Late prenatal care affecting pregnancy, antepartum; and Language barrier on her problem list.  Patient reports no complaints.  Contractions: Not present. Vag. Bleeding: None.  Movement: Present. Denies leaking of fluid.   The following portions of the patient's history were reviewed and updated as appropriate: allergies, current medications, past family history, past medical history, past social history, past surgical history and problem list. Problem list updated.  Objective:   Filed Vitals:   12/05/15 1018  BP: 108/63  Pulse: 77  Temp: 98.1 F (36.7 C)  Weight: 164 lb 4.8 oz (74.526 kg)    Fetal Status: Fetal Heart Rate (bpm): 140 Fundal Height: 36 cm Movement: Present  Presentation: Vertex  General:  Alert, oriented and cooperative. Patient is in no acute distress.  Skin: Skin is warm and dry. No rash noted.   Cardiovascular: Normal heart rate noted  Respiratory: Normal respiratory effort, no problems with respiration noted  Abdomen: Soft, gravid, appropriate for gestational age. Pain/Pressure: Present     Pelvic: Vag. Bleeding: None     Cervical exam performed Dilation: 1.5 Effacement (%): 40 Station: -3  Extremities: Normal range of motion.  Edema: None  Mental Status: Normal mood and affect. Normal behavior. Normal judgment and thought content.   Urinalysis: Urine Protein: 1+ Urine Glucose: Negative  Assessment and Plan:  Pregnancy: G4P3003 at 2662w1d  1. Supervision of normal pregnancy, third trimester - Culture, beta strep (group b only) - GC[facility]Chlamydia probe amp [facility]  Preterm labor symptoms and general obstetric precautions including but not limited to vaginal bleeding, contractions, leaking of fluid and fetal movement were reviewed  in detail with the patient. Please refer to After Visit Summary for other counseling recommendations.  Return in about 1 week (around 12/12/2015).   Tonya [name], CNM

## 2015-12-06 LAB — GC/CHLAMYDIA PROBE AMP (~~LOC~~) NOT AT ARMC
Chlamydia: NEGATIVE
Neisseria Gonorrhea: NEGATIVE

## 2015-12-07 LAB — CULTURE, BETA STREP (GROUP B ONLY)

## 2015-12-12 ENCOUNTER — Ambulatory Visit (INDEPENDENT_AMBULATORY_CARE_PROVIDER_SITE_OTHER): Payer: Medicaid Other | Admitting: Certified Nurse Midwife

## 2015-12-12 VITALS — BP 105/79 | HR 71 | Wt 165.8 lb

## 2015-12-12 DIAGNOSIS — O0933 Supervision of pregnancy with insufficient antenatal care, third trimester: Secondary | ICD-10-CM

## 2015-12-12 DIAGNOSIS — Z789 Other specified health status: Secondary | ICD-10-CM

## 2015-12-12 DIAGNOSIS — Z3493 Encounter for supervision of normal pregnancy, unspecified, third trimester: Secondary | ICD-10-CM

## 2015-12-12 LAB — POCT URINALYSIS DIP (DEVICE)
Bilirubin Urine: NEGATIVE
Glucose, UA: NEGATIVE mg/dL
Hgb urine dipstick: NEGATIVE
Ketones, ur: NEGATIVE mg/dL
Leukocytes, UA: NEGATIVE
Nitrite: NEGATIVE
Protein, ur: NEGATIVE mg/dL
Specific Gravity, Urine: 1.01 (ref 1.005–1.030)
Urobilinogen, UA: 0.2 mg/dL (ref 0.0–1.0)
pH: 5 (ref 5.0–8.0)

## 2015-12-12 NOTE — Patient Instructions (Signed)

## 2015-12-12 NOTE — Progress Notes (Signed)
Subjective:  Tonya [name] is a 30 y.o. G4P3003 at 5767w1d being seen today for ongoing prenatal care.  She is currently monitored for the following issues for this low-risk pregnancy and has Supervision of normal pregnancy; Late prenatal care affecting pregnancy, antepartum; and Language barrier on her problem list.  Patient reports no complaints.  Contractions: Not present. Vag. Bleeding: None, Other.  Movement: Present. Denies leaking of fluid.   The following portions of the patient's history were reviewed and updated as appropriate: allergies, current medications, past family history, past medical history, past social history, past surgical history and problem list. Problem list updated.  Objective:   Filed Vitals:   12/12/15 1120  BP: 105/79  Pulse: 71  Weight: 165 lb 12.8 oz (75.206 kg)    Fetal Status: Fetal Heart Rate (bpm): 126   Movement: Present     General:  Alert, oriented and cooperative. Patient is in no acute distress.  Skin: Skin is warm and dry. No rash noted.   Cardiovascular: Normal heart rate noted  Respiratory: Normal respiratory effort, no problems with respiration noted  Abdomen: Soft, gravid, appropriate for gestational age. Pain/Pressure: Present     Pelvic: Vag. Bleeding: None, Other     Cervical exam performed        Extremities: Normal range of motion.  Edema: None  Mental Status: Normal mood and affect. Normal behavior. Normal judgment and thought content.   Urinalysis:      Assessment and Plan:  Pregnancy: G4P3003 at 3667w1d  1. Language barrier   2. Late prenatal care affecting pregnancy, antepartum, third trimester   3. Supervision of normal pregnancy, third trimester   Term labor symptoms and general obstetric precautions including but not limited to vaginal bleeding, contractions, leaking of fluid and fetal movement were reviewed in detail with the patient. Please refer to After Visit Summary for other counseling recommendations.  Return in  about 1 week (around 12/19/2015).   Rhea PinkLori A Clemmons, CNM

## 2015-12-14 ENCOUNTER — Encounter: Payer: Self-pay | Admitting: General Practice

## 2015-12-15 ENCOUNTER — Encounter (HOSPITAL_COMMUNITY): Payer: Self-pay

## 2015-12-15 ENCOUNTER — Inpatient Hospital Stay (HOSPITAL_COMMUNITY)
Admission: AD | Admit: 2015-12-15 | Discharge: 2015-12-15 | Disposition: A | Discharge status: Home / Self Care | Payer: Medicaid Other | Source: Ambulatory Visit | Attending: Family Medicine | Admitting: Family Medicine

## 2015-12-15 DIAGNOSIS — Z3493 Encounter for supervision of normal pregnancy, unspecified, third trimester: Secondary | ICD-10-CM

## 2015-12-15 DIAGNOSIS — O0933 Supervision of pregnancy with insufficient antenatal care, third trimester: Secondary | ICD-10-CM

## 2015-12-15 HISTORY — DX: Other specified health status: Z78.9

## 2015-12-15 NOTE — MAU Note (Signed)
Contractions since yesterday. Now hurts to walk. Has general abd pain but then pain down low that comes and goes. Denies LOF or bleeding.

## 2015-12-19 ENCOUNTER — Ambulatory Visit (INDEPENDENT_AMBULATORY_CARE_PROVIDER_SITE_OTHER): Payer: Medicaid Other | Admitting: Family

## 2015-12-19 ENCOUNTER — Encounter: Payer: Medicaid Other | Admitting: Family

## 2015-12-19 ENCOUNTER — Telehealth: Payer: Self-pay

## 2015-12-19 VITALS — BP 131/79 | HR 82 | Wt 162.6 lb

## 2015-12-19 DIAGNOSIS — O26892 Other specified pregnancy related conditions, second trimester: Secondary | ICD-10-CM

## 2015-12-19 DIAGNOSIS — R519 Headache, unspecified: Secondary | ICD-10-CM

## 2015-12-19 DIAGNOSIS — R51 Headache: Secondary | ICD-10-CM

## 2015-12-19 DIAGNOSIS — O0933 Supervision of pregnancy with insufficient antenatal care, third trimester: Secondary | ICD-10-CM

## 2015-12-19 DIAGNOSIS — K219 Gastro-esophageal reflux disease without esophagitis: Secondary | ICD-10-CM

## 2015-12-19 DIAGNOSIS — O99613 Diseases of the digestive system complicating pregnancy, third trimester: Secondary | ICD-10-CM

## 2015-12-19 DIAGNOSIS — Z3493 Encounter for supervision of normal pregnancy, unspecified, third trimester: Secondary | ICD-10-CM

## 2015-12-19 DIAGNOSIS — O26893 Other specified pregnancy related conditions, third trimester: Secondary | ICD-10-CM

## 2015-12-19 MED ORDER — CYCLOBENZAPRINE HCL 10 MG PO TABS: ORAL_TABLET | ORAL | Status: DC

## 2015-12-19 MED ORDER — FAMOTIDINE 40 MG PO TABS: 40.0000 mg | ORAL_TABLET | Freq: Every day | ORAL | Status: DC

## 2015-12-19 NOTE — Progress Notes (Signed)
Pt having hard time with reflux. She would like something called in to her pharmacy.

## 2015-12-19 NOTE — Progress Notes (Signed)
Subjective:  Tonya [name] is a 30 y.o. G4P3003 at 4258w1d being seen today for ongoing prenatal care.  She is currently monitored for the following issues for this low-risk pregnancy and has Supervision of normal pregnancy; Late prenatal care affecting pregnancy, antepartum; and Language barrier on her problem list.  Patient reports intermittent headache and GERD.  Contractions: Irregular.  .  Movement: Present. Denies leaking of fluid.   The following portions of the patient's history were reviewed and updated as appropriate: allergies, current medications, past family history, past medical history, past social history, past surgical history and problem list. Problem list updated.  Objective:   Filed Vitals:   12/19/15 0906  BP: 131/79  Pulse: 82  Weight: 162 lb 9.6 oz (73.755 kg)    Fetal Status: Fetal Heart Rate (bpm): 131 Fundal Height: 37 cm Movement: Present  Presentation: Vertex  General:  Alert, oriented and cooperative. Patient is in no acute distress.  Skin: Skin is warm and dry. No rash noted.   Cardiovascular: Normal heart rate noted  Respiratory: Normal respiratory effort, no problems with respiration noted  Abdomen: Soft, gravid, appropriate for gestational age. Pain/Pressure: Present     Pelvic:       Cervical exam deferred        Extremities: Normal range of motion.  Edema: Trace  Mental Status: Normal mood and affect. Normal behavior. Normal judgment and thought content.   Urinalysis:     Urine results not available at discharge.    Assessment and Plan:  Pregnancy: G4P3003 at 4358w1d  1. Gastroesophageal reflux disease, esophagitis presence not specified - famotidine (PEPCID) 40 MG tablet; Take 1 tablet (40 mg total) by mouth daily.  Dispense: 30 tablet; Refill: 1  2. . Headache in pregnancy, third trimester - cyclobenzaprine (FLEXERIL) 10 MG tablet; For headaches  Dispense: 30 tablet; Refill: 1  3.  Late prenatal care affecting pregnancy, antepartum, third  trimester  Term labor symptoms and general obstetric precautions including but not limited to vaginal bleeding, contractions, leaking of fluid and fetal movement were reviewed in detail with the patient. Please refer to After Visit Summary for other counseling recommendations.  Return for appt and NST.   Eino FarberWalidah Kennith GainN Karim, CNM

## 2015-12-19 NOTE — Telephone Encounter (Signed)
error 

## 2015-12-22 ENCOUNTER — Encounter (HOSPITAL_COMMUNITY): Payer: Self-pay | Admitting: *Deleted

## 2015-12-22 ENCOUNTER — Inpatient Hospital Stay (HOSPITAL_COMMUNITY)
Admission: AD | Admit: 2015-12-22 | Discharge: 2015-12-22 | Disposition: A | Discharge status: Home / Self Care | Payer: Medicaid Other | Source: Ambulatory Visit | Attending: Obstetrics and Gynecology | Admitting: Obstetrics and Gynecology

## 2015-12-22 DIAGNOSIS — Z3493 Encounter for supervision of normal pregnancy, unspecified, third trimester: Secondary | ICD-10-CM | POA: Diagnosis not present

## 2015-12-22 DIAGNOSIS — O0933 Supervision of pregnancy with insufficient antenatal care, third trimester: Secondary | ICD-10-CM

## 2015-12-22 NOTE — MAU Note (Signed)
SAYS SHE STARTED   FEELING UC  STRONG  AT 6 PM.     3 WEEKS AGO  VE  1-2  CM.    GBS- NEG.  [facility]

## 2015-12-22 NOTE — MAU Note (Signed)
Contractions for couple days but now more consistent for couple hours. Denies LOF or bleeding. 2cm 3wks ago

## 2015-12-25 ENCOUNTER — Inpatient Hospital Stay (HOSPITAL_COMMUNITY)
Admission: AD | Admit: 2015-12-25 | Discharge: 2015-12-25 | Disposition: A | Discharge status: Home / Self Care | Payer: Medicaid Other | Source: Ambulatory Visit | Attending: Family Medicine | Admitting: Family Medicine

## 2015-12-25 DIAGNOSIS — Z3A39 39 weeks gestation of pregnancy: Secondary | ICD-10-CM | POA: Diagnosis not present

## 2015-12-25 NOTE — Discharge Instructions (Signed)
Keep follow-up appointment with Primary Care Provider, and call or come to Maternity Admissions Dept for any problems.

## 2015-12-25 NOTE — MAU Note (Signed)
Patient has been having contractions since last night, but got worse at 1 PM today. Denies bleeding or leaking of fluid from vagina. States has only seen some vaginal discharge. States baby is moving the normal amount.

## 2015-12-27 ENCOUNTER — Ambulatory Visit (HOSPITAL_COMMUNITY)
Admission: RE | Admit: 2015-12-27 | Discharge: 2015-12-27 | Disposition: A | Discharge status: Home / Self Care | Payer: Medicaid Other | Source: Ambulatory Visit | Attending: Student | Admitting: Student

## 2015-12-27 ENCOUNTER — Ambulatory Visit (INDEPENDENT_AMBULATORY_CARE_PROVIDER_SITE_OTHER): Payer: Medicaid Other | Admitting: Student

## 2015-12-27 VITALS — BP 106/85 | HR 76 | Wt 162.0 lb

## 2015-12-27 DIAGNOSIS — O99613 Diseases of the digestive system complicating pregnancy, third trimester: Secondary | ICD-10-CM | POA: Diagnosis not present

## 2015-12-27 DIAGNOSIS — Z3493 Encounter for supervision of normal pregnancy, unspecified, third trimester: Secondary | ICD-10-CM | POA: Diagnosis present

## 2015-12-27 DIAGNOSIS — O480 Post-term pregnancy: Secondary | ICD-10-CM | POA: Diagnosis not present

## 2015-12-27 DIAGNOSIS — O0933 Supervision of pregnancy with insufficient antenatal care, third trimester: Secondary | ICD-10-CM | POA: Diagnosis not present

## 2015-12-27 DIAGNOSIS — K219 Gastro-esophageal reflux disease without esophagitis: Secondary | ICD-10-CM | POA: Diagnosis not present

## 2015-12-27 DIAGNOSIS — O288 Other abnormal findings on antenatal screening of mother: Secondary | ICD-10-CM

## 2015-12-27 DIAGNOSIS — O283 Abnormal ultrasonic finding on antenatal screening of mother: Secondary | ICD-10-CM | POA: Diagnosis not present

## 2015-12-27 DIAGNOSIS — Z3A40 40 weeks gestation of pregnancy: Secondary | ICD-10-CM | POA: Diagnosis not present

## 2015-12-27 DIAGNOSIS — O289 Unspecified abnormal findings on antenatal screening of mother: Secondary | ICD-10-CM

## 2015-12-27 LAB — POCT URINALYSIS DIP (DEVICE)
Bilirubin Urine: NEGATIVE
Glucose, UA: NEGATIVE mg/dL
Hgb urine dipstick: NEGATIVE
Ketones, ur: NEGATIVE mg/dL
Leukocytes, UA: NEGATIVE
NITRITE: NEGATIVE
PROTEIN: NEGATIVE mg/dL
UROBILINOGEN UA: 0.2 mg/dL (ref 0.0–1.0)
pH: 5 (ref 5.0–8.0)

## 2015-12-27 NOTE — Progress Notes (Signed)
Subjective:  Tonya [name] is a 30 y.o. G4P3003 at 6169w2d being seen today for ongoing prenatal care.  She is currently monitored for the following issues for this low-risk pregnancy and has Supervision of normal pregnancy; Late prenatal care affecting pregnancy, antepartum; and Language barrier on her problem list.  Patient reports occasional contractions.  Contractions: Irregular. Vag. Bleeding: Scant.  Movement: Present. Denies leaking of fluid.   The following portions of the patient's history were reviewed and updated as appropriate: allergies, current medications, past family history, past medical history, past social history, past surgical history and problem list. Problem list updated.  Objective:   Filed Vitals:   12/27/15 1106  BP: 106/85  Pulse: 76  Weight: 162 lb (73.483 kg)    Fetal Status:     Movement: Present     General:  Alert, oriented and cooperative. Patient is in no acute distress.  Skin: Skin is warm and dry. No rash noted.   Cardiovascular: Normal heart rate noted  Respiratory: Normal respiratory effort, no problems with respiration noted  Abdomen: Soft, gravid, appropriate for gestational age. Pain/Pressure: Present     Pelvic: Vag. Bleeding: Scant     Cervical exam performed       2.5/50/-3, vertex  Extremities: Normal range of motion.  Edema: None  Mental Status: Normal mood and affect. Normal behavior. Normal judgment and thought content.   Urinalysis: Urine Protein: Negative Urine Glucose: Negative  Assessment and Plan:  Pregnancy: G4P3003 at 7769w2d  1. Late prenatal care affecting pregnancy, antepartum, third trimester  - Fetal nonstress test - US MFM FETAL BPP WO NON STRESS; Future -Cervix unchanged from previous visit over the weekend (per patient)  2. Supervision of normal pregnancy, third trimester   3. Post-term pregnancy, 40-42 weeks of gestation  - Fetal nonstress test - US MFM FETAL BPP WO NON STRESS; Future  4. Non-reactive NST  (non-stress test)  - US MFM FETAL BPP WO NON STRESS; Future  Term labor symptoms and general obstetric precautions including but not limited to vaginal bleeding, contractions, leaking of fluid and fetal movement were reviewed in detail with the patient. Please refer to After Visit Summary for other counseling recommendations.  Return in about 2 days (around 12/29/2015) for NST.   Judeth HornErin Tykesha Konicki, NP

## 2015-12-27 NOTE — Patient Instructions (Signed)
Braxton Hicks Contractions °Contractions of the uterus can occur throughout pregnancy. Contractions are not always a sign that you are in labor.  °WHAT ARE BRAXTON HICKS CONTRACTIONS?  °Contractions that occur before labor are called Braxton Hicks contractions, or false labor. Toward the end of pregnancy (32-34 weeks), these contractions can develop more often and may become more forceful. This is not true labor because these contractions do not result in opening (dilatation) and thinning of the cervix. They are sometimes difficult to tell apart from true labor because these contractions can be forceful and people have different pain tolerances. You should not feel embarrassed if you go to the hospital with false labor. Sometimes, the only way to tell if you are in true labor is for your health care provider to look for changes in the cervix. °If there are no prenatal problems or other health problems associated with the pregnancy, it is completely safe to be sent home with false labor and await the onset of true labor. °HOW CAN YOU TELL THE DIFFERENCE BETWEEN TRUE AND FALSE LABOR? °False Labor °· The contractions of false labor are usually shorter and not as hard as those of true labor.   °· The contractions are usually irregular.   °· The contractions are often felt in the front of the lower abdomen and in the groin.   °· The contractions may go away when you walk around or change positions while lying down.   °· The contractions get weaker and are shorter lasting as time goes on.   °· The contractions do not usually become progressively stronger, regular, and closer together as with true labor.   °True Labor °· Contractions in true labor last 30-70 seconds, become very regular, usually become more intense, and increase in frequency.   °· The contractions do not go away with walking.   °· The discomfort is usually felt in the top of the uterus and spreads to the lower abdomen and low back.   °· True labor can be  determined by your health care provider with an exam. This will show that the cervix is dilating and getting thinner.   °WHAT TO REMEMBER °· Keep up with your usual exercises and follow other instructions given by your health care provider.   °· Take medicines as directed by your health care provider.   °· Keep your regular prenatal appointments.   °· Eat and drink lightly if you think you are going into labor.   °· If Braxton Hicks contractions are making you uncomfortable:   °¨ Change your position from lying down or resting to walking, or from walking to resting.   °¨ Sit and rest in a tub of warm water.   °¨ Drink 2-3 glasses of water. Dehydration may cause these contractions.   °¨ Do slow and deep breathing several times an hour.   °WHEN SHOULD I SEEK IMMEDIATE MEDICAL CARE? °Seek immediate medical care if: °· Your contractions become stronger, more regular, and closer together.   °· You have fluid leaking or gushing from your vagina.   °· You have a fever.   °· You pass blood-tinged mucus.   °· You have vaginal bleeding.   °· You have continuous abdominal pain.   °· You have low back pain that you never had before.   °· You feel your baby's head pushing down and causing pelvic pressure.   °· Your baby is not moving as much as it used to.   °  °This information is not intended to replace advice given to you by your health care provider. Make sure you discuss any questions you have with your health care   provider. °  °Document Released: 08/12/2005 Document Revised: 08/17/2013 Document Reviewed: 05/24/2013 °Elsevier Interactive Patient Education ©2016 [employer] ° °

## 2015-12-29 ENCOUNTER — Other Ambulatory Visit: Payer: Self-pay | Admitting: Obstetrics & Gynecology

## 2015-12-29 ENCOUNTER — Ambulatory Visit (HOSPITAL_COMMUNITY)
Admission: RE | Admit: 2015-12-29 | Discharge: 2015-12-29 | Disposition: A | Discharge status: Home / Self Care | Payer: Medicaid Other | Source: Ambulatory Visit | Attending: Obstetrics & Gynecology | Admitting: Obstetrics & Gynecology

## 2015-12-29 ENCOUNTER — Ambulatory Visit (INDEPENDENT_AMBULATORY_CARE_PROVIDER_SITE_OTHER): Payer: Medicaid Other | Admitting: *Deleted

## 2015-12-29 ENCOUNTER — Other Ambulatory Visit: Payer: Self-pay | Admitting: Advanced Practice Midwife

## 2015-12-29 ENCOUNTER — Telehealth (HOSPITAL_COMMUNITY): Payer: Self-pay | Admitting: *Deleted

## 2015-12-29 DIAGNOSIS — O480 Post-term pregnancy: Secondary | ICD-10-CM | POA: Insufficient documentation

## 2015-12-29 DIAGNOSIS — O0933 Supervision of pregnancy with insufficient antenatal care, third trimester: Secondary | ICD-10-CM | POA: Insufficient documentation

## 2015-12-29 DIAGNOSIS — Z3A40 40 weeks gestation of pregnancy: Secondary | ICD-10-CM | POA: Diagnosis not present

## 2015-12-29 NOTE — Telephone Encounter (Signed)
Preadmission screen Interpreter number [phone]

## 2015-12-29 NOTE — Progress Notes (Signed)
NST performed today was reviewed and was found to be reactive.  Continue recommended antenatal testing and prenatal care.  

## 2015-12-29 NOTE — Progress Notes (Signed)
For NSt for postdates. Used Video interpreter [phone] Amina.  Scheduled for AfI after NST . Scheduled for IOL for postdates.

## 2015-12-31 ENCOUNTER — Inpatient Hospital Stay (HOSPITAL_COMMUNITY)
Admission: RE | Admit: 2015-12-31 | Discharge: 2016-01-01 | DRG: 775 | Disposition: A | Discharge status: Home / Self Care | Payer: Medicaid Other | Source: Ambulatory Visit | Attending: Obstetrics & Gynecology | Admitting: Obstetrics & Gynecology

## 2015-12-31 DIAGNOSIS — Z23 Encounter for immunization: Secondary | ICD-10-CM | POA: Diagnosis not present

## 2015-12-31 DIAGNOSIS — Z3A40 40 weeks gestation of pregnancy: Secondary | ICD-10-CM | POA: Diagnosis not present

## 2015-12-31 DIAGNOSIS — O480 Post-term pregnancy: Secondary | ICD-10-CM | POA: Diagnosis present

## 2015-12-31 DIAGNOSIS — Z3A41 41 weeks gestation of pregnancy: Secondary | ICD-10-CM

## 2015-12-31 LAB — TYPE AND SCREEN
ABO/RH(D): B POS
Antibody Screen: NEGATIVE

## 2015-12-31 LAB — CBC
HCT: 31 % — ABNORMAL LOW (ref 36.0–46.0)
HEMOGLOBIN: 10.2 g/dL — AB (ref 12.0–15.0)
MCH: 23.9 pg — AB (ref 26.0–34.0)
MCHC: 32.9 g/dL (ref 30.0–36.0)
MCV: 72.8 fL — AB (ref 78.0–100.0)
Platelets: 234 10*3/uL (ref 150–400)
RBC: 4.26 MIL/uL (ref 3.87–5.11)
RDW: 16.7 % — ABNORMAL HIGH (ref 11.5–15.5)
WBC: 6.6 10*3/uL (ref 4.0–10.5)

## 2015-12-31 LAB — ABO/RH: ABO/RH(D): B POS

## 2015-12-31 LAB — RPR: RPR: NONREACTIVE

## 2015-12-31 MED ORDER — MISOPROSTOL 200 MCG PO TABS
800.0000 ug | ORAL_TABLET | Freq: Once | ORAL | Status: AC
  Administered 2015-12-31: 800 ug via ORAL

## 2015-12-31 MED ORDER — DIPHENHYDRAMINE HCL 25 MG PO CAPS: 25.0000 mg | ORAL_CAPSULE | Freq: Four times a day (QID) | ORAL | Status: DC | PRN

## 2015-12-31 MED ORDER — ZOLPIDEM TARTRATE 5 MG PO TABS: 5.0000 mg | ORAL_TABLET | Freq: Every evening | ORAL | Status: DC | PRN

## 2015-12-31 MED ORDER — PRENATAL MULTIVITAMIN CH
1.0000 | ORAL_TABLET | Freq: Every day | ORAL | Status: DC
  Administered 2015-12-31 – 2016-01-01 (×2): 1 via ORAL
  Filled 2015-12-31 (×2): qty 1

## 2015-12-31 MED ORDER — SIMETHICONE 80 MG PO CHEW: 80.0000 mg | CHEWABLE_TABLET | ORAL | Status: DC | PRN

## 2015-12-31 MED ORDER — ONDANSETRON HCL 4 MG/2ML IJ SOLN: 4.0000 mg | INTRAMUSCULAR | Status: DC | PRN

## 2015-12-31 MED ORDER — LIDOCAINE HCL (PF) 1 % IJ SOLN
30.0000 mL | INTRAMUSCULAR | Status: DC | PRN
  Administered 2015-12-31: 30 mL via SUBCUTANEOUS
  Filled 2015-12-31: qty 30

## 2015-12-31 MED ORDER — WITCH HAZEL-GLYCERIN EX PADS: 1.0000 "application " | MEDICATED_PAD | CUTANEOUS | Status: DC | PRN

## 2015-12-31 MED ORDER — ONDANSETRON HCL 4 MG/2ML IJ SOLN: 4.0000 mg | Freq: Four times a day (QID) | INTRAMUSCULAR | Status: DC | PRN

## 2015-12-31 MED ORDER — IBUPROFEN 600 MG PO TABS
600.0000 mg | ORAL_TABLET | Freq: Four times a day (QID) | ORAL | Status: DC
  Administered 2015-12-31 – 2016-01-01 (×6): 600 mg via ORAL
  Filled 2015-12-31 (×6): qty 1

## 2015-12-31 MED ORDER — COCONUT OIL OIL: 1.0000 "application " | TOPICAL_OIL | Status: DC | PRN

## 2015-12-31 MED ORDER — MISOPROSTOL 200 MCG PO TABS
ORAL_TABLET | ORAL | Status: AC
Start: 2015-12-31 — End: 2015-12-31
  Filled 2015-12-31: qty 4

## 2015-12-31 MED ORDER — OXYTOCIN BOLUS FROM INFUSION
500.0000 mL | INTRAVENOUS | Status: DC
  Administered 2015-12-31: 500 mL via INTRAVENOUS

## 2015-12-31 MED ORDER — OXYCODONE-ACETAMINOPHEN 5-325 MG PO TABS: 2.0000 | ORAL_TABLET | ORAL | Status: DC | PRN

## 2015-12-31 MED ORDER — TETANUS-DIPHTH-ACELL PERTUSSIS 5-2.5-18.5 LF-MCG/0.5 IM SUSP: 0.5000 mL | Freq: Once | INTRAMUSCULAR | Status: DC

## 2015-12-31 MED ORDER — CITRIC ACID-SODIUM CITRATE 334-500 MG/5ML PO SOLN: 30.0000 mL | ORAL | Status: DC | PRN

## 2015-12-31 MED ORDER — OXYCODONE-ACETAMINOPHEN 5-325 MG PO TABS: 1.0000 | ORAL_TABLET | ORAL | Status: DC | PRN

## 2015-12-31 MED ORDER — LACTATED RINGERS IV SOLN
INTRAVENOUS | Status: DC
  Administered 2015-12-31: 01:00:00 via INTRAVENOUS

## 2015-12-31 MED ORDER — LACTATED RINGERS IV SOLN
2.5000 [IU]/h | INTRAVENOUS | Status: DC
  Administered 2015-12-31: 2.5 [IU]/h via INTRAVENOUS

## 2015-12-31 MED ORDER — BENZOCAINE-MENTHOL 20-0.5 % EX AERO
1.0000 "application " | INHALATION_SPRAY | CUTANEOUS | Status: DC | PRN
  Administered 2015-12-31: 1 via TOPICAL
  Filled 2015-12-31: qty 56

## 2015-12-31 MED ORDER — LACTATED RINGERS IV SOLN: 500.0000 mL | INTRAVENOUS | Status: DC | PRN

## 2015-12-31 MED ORDER — ACETAMINOPHEN 325 MG PO TABS: 650.0000 mg | ORAL_TABLET | ORAL | Status: DC | PRN

## 2015-12-31 MED ORDER — SENNOSIDES-DOCUSATE SODIUM 8.6-50 MG PO TABS
2.0000 | ORAL_TABLET | ORAL | Status: DC
  Administered 2015-12-31: 2 via ORAL
  Filled 2015-12-31: qty 2

## 2015-12-31 MED ORDER — OXYTOCIN 10 UNIT/ML IJ SOLN
1.0000 m[IU]/min | INTRAVENOUS | Status: DC
  Administered 2015-12-31: 2 m[IU]/min via INTRAVENOUS
  Filled 2015-12-31: qty 4

## 2015-12-31 MED ORDER — ONDANSETRON HCL 4 MG PO TABS: 4.0000 mg | ORAL_TABLET | ORAL | Status: DC | PRN

## 2015-12-31 MED ORDER — TERBUTALINE SULFATE 1 MG/ML IJ SOLN
0.2500 mg | Freq: Once | INTRAMUSCULAR | Status: DC | PRN
  Filled 2015-12-31: qty 1

## 2015-12-31 MED ORDER — DIBUCAINE 1 % RE OINT: 1.0000 "application " | TOPICAL_OINTMENT | RECTAL | Status: DC | PRN

## 2015-12-31 NOTE — Lactation Note (Signed)
This note was copied from a baby's chart. Lactation Consultation Note  Mom is tired and parents are requesting formula. Mom permitted lactation consultant to hand express colostrum and 4 ml of colostrum was easily expressed. Explained to parents that this was the best food for the baby and dad agreed. He asked for an artificial nipple so teaching was done regarding use of it.  There is 3 ml at the bedside to feed the baby if mom remains too tired. Information on lactation support was left at the bedside. Patient Name: Tonya [name] Today's Date: 12/31/2015 Reason for consult: Initial assessment   Maternal Data Has patient been taught Hand Expression?: Yes Does the patient have breastfeeding experience prior to this delivery?: Yes  Feeding Feeding Type: Breast Milk Length of feed: 30 min  LATCH Score/Interventions                      Lactation Tools Discussed/Used     Consult Status      Soyla DryerJoseph, Jeweldean Drohan 12/31/2015, 3:59 PM

## 2015-12-31 NOTE — Anesthesia Pain Management Evaluation Note (Signed)
  CRNA Pain Management Visit Note  Patient: Tonya [name], 30 y.o., female  "Hello I am a [facility]. We have an anesthesia team available at all times to provide care throughout the hospital, including epidural management and anesthesia for C-section. I don't know your plan for the delivery whether it a natural birth, water birth, IV sedation, nitrous supplementation, doula or epidural, but we want to meet your pain goals."   1.Was your pain managed to your expectations on prior hospitalizations?   Yes per husband  2.What is your expectation for pain management during this hospitalization?  Constant monitoring     3.How can we help you reach that goal? Education and pain intervention  Record the patient's initial score and the patient's pain goal. 0/10   Pain: 0/10  Pain Goal: 4/10 [facility] wants you to be able to say your pain was always managed very well.  Tonya [name], Tonya [name] 12/31/2015

## 2015-12-31 NOTE — H&P (Signed)
Tonya [name] is a 30 y.o. female [phone]  presenting for IOL for postdates.  She reports good fetal movement, denies regular contractions, LOF, vaginal bleeding, vaginal itching/burning, urinary symptoms, h/a, dizziness, n/v, or fever/chills.     Clinic [facility] Prenatal Labs  Dating LMP Blood type:   Bpos  Genetic Screen Too late Antibody: neg  Anatomic US WNL  Rubella:  Immune  GTT  Third trimester: 86 RPR:   NR  Flu vaccine declines HBsAg:   Neg  TDaP vaccine 10/16/15                                HIV:   NR  Baby Food  Breast                                          GBS: Neg  Contraception  Pap:WNL  Circumcision  n/a female CF neg  Pediatrician  [facility] Normal Hgb pattern  Support Person  Philis Nettle (husband)     Maternal Medical History:  Reason for admission: Nausea.  Contractions: Frequency: irregular.   Perceived severity is mild.    Fetal activity: Perceived fetal activity is normal.    Prenatal complications: no prenatal complications Prenatal Complications - Diabetes: none.    OB History    Gravida Para Term Preterm AB TAB SAB Ectopic Multiple Living   Past Medical History  Diagnosis Date  . Medical history non-contributory    Past Surgical History  Procedure Laterality Date  . No past surgeries     Family History: family history is negative for Alcohol abuse, Arthritis, Asthma, Birth defects, Cancer, COPD, Depression, Diabetes, Drug abuse, Early death, Hearing loss, Heart disease, Hyperlipidemia, Hypertension, Kidney disease, Learning disabilities, Mental illness, Mental retardation, Miscarriages / Stillbirths, Stroke, Vision loss, and Varicose Veins. Social History:  reports that she has never smoked. She does not have any smokeless tobacco history on file. She reports that she does not drink alcohol or use illicit drugs.   Prenatal Transfer Tool  Maternal Diabetes: No Genetic Screening: Declined Maternal Ultrasounds/Referrals:  Normal Fetal Ultrasounds or other Referrals:  None Maternal Substance Abuse:  No Significant Maternal Medications:  None Significant Maternal Lab Results:  Lab values include: Group B Strep negative Other Comments:  None  Review of Systems  Constitutional: Negative for fever, chills and malaise/fatigue.  Eyes: Negative for blurred vision.  Respiratory: Negative for cough and shortness of breath.   Cardiovascular: Negative for chest pain.  Gastrointestinal: Negative for heartburn, nausea and vomiting.  Genitourinary: Negative for dysuria, urgency and frequency.  Musculoskeletal: Negative.   Neurological: Negative for dizziness and headaches.  Psychiatric/Behavioral: Negative for depression.    Dilation: 2.5 Effacement (%): 50 Station: -2 Exam by:: D Jasso, RN Blood pressure 123/70, pulse 71, temperature 97.9 F (36.6 C), temperature source Oral, resp. rate 18, height  (1.626 m), weight 73.936 kg (163 lb), last menstrual period 03/20/2015. Maternal Exam:  Uterine Assessment: Contraction strength is mild.  Contraction frequency is irregular.   Abdomen: Patient reports no abdominal tenderness. Fetal presentation: vertex  Cervix: Cervix evaluated by digital exam.     Fetal Exam Fetal Monitor Review: Mode: ultrasound.   Baseline rate: 135.  Variability: moderate (6-25 bpm).   Pattern: accelerations present and  no decelerations.    Fetal State Assessment: Category I - tracings are normal.     Physical Exam  Nursing note and vitals reviewed. Constitutional: She is oriented to person, place, and time. She appears well-developed and well-nourished.  Neck: Normal range of motion.  Cardiovascular: Normal rate, regular rhythm and normal heart sounds.   Respiratory: Effort normal.  GI: Soft.  Musculoskeletal: Normal range of motion.  Neurological: She is alert and oriented to person, place, and time.  Skin: Skin is warm and dry.  Psychiatric: She has a normal mood and  affect. Her behavior is normal. Judgment and thought content normal.    Prenatal labs: ABO, Rh: --/--/B POS, B POS (05/07 0120) Antibody: NEG (05/07 0120) Rubella: 2.00 (02/20 1005) RPR: NON REAC (02/20 1005)  HBsAg: NEGATIVE (02/20 1005)  HIV: NONREACTIVE (02/20 1005)  GBS: Negative (04/11 0000)   Assessment/Plan: 30 yo G4P3003 @[redacted]w[redacted]d  by LMP c/w 30 week US Late to prenatal care  Plan for IOL for postdates Pitocin to start at 2 milliunits/min, increase by 2 per protocol May have epidural or pain management options as desired Anticipate NSVD    LEFTWICH-KIRBY, Ephraim Reichel 12/31/2015, 4:31 AM

## 2016-01-01 ENCOUNTER — Encounter (HOSPITAL_COMMUNITY): Payer: Self-pay

## 2016-01-01 LAB — CBC
HEMATOCRIT: 28.3 % — AB (ref 36.0–46.0)
Hemoglobin: 9.3 g/dL — ABNORMAL LOW (ref 12.0–15.0)
MCH: 24.2 pg — ABNORMAL LOW (ref 26.0–34.0)
MCHC: 32.9 g/dL (ref 30.0–36.0)
MCV: 73.5 fL — ABNORMAL LOW (ref 78.0–100.0)
PLATELETS: 171 10*3/uL (ref 150–400)
RBC: 3.85 MIL/uL — ABNORMAL LOW (ref 3.87–5.11)
RDW: 16.8 % — AB (ref 11.5–15.5)
WBC: 6.7 10*3/uL (ref 4.0–10.5)

## 2016-01-01 MED ORDER — IBUPROFEN 600 MG PO TABS: 600.0000 mg | ORAL_TABLET | Freq: Four times a day (QID) | ORAL | Status: DC

## 2016-01-01 NOTE — Progress Notes (Signed)
Discharge teaching and paperwork discussed using interpreter [phone].  Pt denies questions or concerns.  Discharge paperwork signed.

## 2016-01-01 NOTE — Progress Notes (Signed)
UR chart review completed.  

## 2016-01-01 NOTE — Discharge Summary (Signed)
OB Discharge Summary  Patient Name: Tonya KeysHalimo Staszewski DOB: 06/22/1986 MRN: [id number]  Date of admission: 12/31/2015 Delivering MD: Sharen CounterLEFTWICH-KIRBY, LISA A   Date of discharge: 01/01/2016  Admitting diagnosis: INDUCTION Intrauterine pregnancy: 9243w0d     Secondary diagnosis:Active Problems:   Post term pregnancy over 40 weeks   NSVD (normal spontaneous vaginal delivery)  Additional problems:none     Discharge diagnosis: Term Pregnancy Delivered                                                                     Post partum procedures:none  Augmentation: Pitocin  Complications: None  Hospital course:  Onset of Labor With Vaginal Delivery     30 y.o. yo [id number] at 2943w0d was admitted in Active Labor on 12/31/2015. Patient had an uncomplicated labor course as follows:  Membrane Rupture Time/Date: 5:25 AM ,12/31/2015   Intrapartum Procedures: Episiotomy: None [1]                                         Lacerations:  2nd degree [3];Perineal [11]  Patient had a delivery of a Viable infant. 12/31/2015  Information for the patient's newborn:  Rene PaciHussein, Girl Terilyn [[id number]][[id number]]  Delivery Method: Vaginal, Spontaneous Delivery (Filed from Delivery Summary)    Pateint had an uncomplicated postpartum course.  She is ambulating, tolerating a regular diet, passing flatus, and urinating well. Patient is discharged home in stable condition on 01/01/2016.    Physical exam  Filed Vitals:   12/31/15 0731 12/31/15 0800 12/31/15 0900 12/31/15 1230  BP: 130/70 131/74 105/64 127/67  Pulse: 78 74 81 72  Temp:  99.5 F (37.5 C) 99.7 F (37.6 C) 98.8 F (37.1 C)  TempSrc:  Oral Oral Oral  Resp: 18 18 18 18   Height:      Weight:      SpO2:  100% 100% 100%   General: alert, cooperative and no distress Lochia: appropriate Uterine Fundus: firm Incision: N/A DVT Evaluation: No evidence of DVT seen on physical exam. Negative Homan's sign. No cords or calf tenderness. Labs: Lab Results  Component  Value Date   WBC 6.6 12/31/2015   HGB 10.2* 12/31/2015   HCT 31.0* 12/31/2015   MCV 72.8* 12/31/2015   PLT 234 12/31/2015   CMP Latest Ref Rng 09/24/2015  Glucose 65 - 99 mg/dL 79  BUN 6 - 20 mg/dL 10  Creatinine 4.330.44 - 2.951.00 mg/dL 1.880.67  Sodium 416135 - 606145 mmol/L 137  Potassium 3.5 - 5.1 mmol/L 3.5  Chloride 101 - 111 mmol/L 105  CO2 22 - 32 mmol/L 25  Calcium 8.9 - 10.3 mg/dL 3.0(Z8.6(L)  Total Protein 6.5 - 8.1 g/dL 6.2(L)  Total Bilirubin 0.3 - 1.2 mg/dL 0.6  Alkaline Phos 38 - 126 U/L 76  AST 15 - 41 U/L 17  ALT 14 - 54 U/L 10(L)    Discharge instruction: per After Visit Summary and "Baby and Me Booklet".  After Visit Meds:    Medication List    Notice    You have not been prescribed any medications.      Diet: routine diet  Activity: Advance as tolerated.  Pelvic rest for 6 weeks.   Outpatient follow up:6 weeks Follow up Appt:No future appointments. Follow up visit: No Follow-up on file.  Postpartum contraception: declines  Newborn Data: Live born female  Birth Weight: 6 lb 3.1 oz (2810 g) APGAR: 9, 9  Baby Feeding: Breast Disposition:home with mother   01/01/2016 Wyvonnia Dusky, CNM

## 2016-01-08 ENCOUNTER — Encounter (HOSPITAL_COMMUNITY): Payer: Self-pay

## 2016-02-12 ENCOUNTER — Telehealth: Payer: Self-pay

## 2016-02-12 ENCOUNTER — Ambulatory Visit: Payer: Medicaid Other | Admitting: Medical

## 2016-02-19 NOTE — Telephone Encounter (Signed)
Entered in error

## 2016-04-07 ENCOUNTER — Encounter (HOSPITAL_COMMUNITY): Payer: Self-pay | Admitting: Emergency Medicine

## 2016-04-07 ENCOUNTER — Emergency Department (HOSPITAL_COMMUNITY)
Admission: EM | Admit: 2016-04-07 | Discharge: 2016-04-07 | Disposition: A | Discharge status: Home / Self Care | Payer: Medicaid Other | Attending: Emergency Medicine | Admitting: Emergency Medicine

## 2016-04-07 ENCOUNTER — Emergency Department (HOSPITAL_COMMUNITY): Payer: Medicaid Other

## 2016-04-07 DIAGNOSIS — S39012A Strain of muscle, fascia and tendon of lower back, initial encounter: Secondary | ICD-10-CM | POA: Insufficient documentation

## 2016-04-07 DIAGNOSIS — Y999 Unspecified external cause status: Secondary | ICD-10-CM | POA: Insufficient documentation

## 2016-04-07 DIAGNOSIS — Y939 Activity, unspecified: Secondary | ICD-10-CM | POA: Insufficient documentation

## 2016-04-07 DIAGNOSIS — Y929 Unspecified place or not applicable: Secondary | ICD-10-CM | POA: Insufficient documentation

## 2016-04-07 DIAGNOSIS — X58XXXA Exposure to other specified factors, initial encounter: Secondary | ICD-10-CM | POA: Insufficient documentation

## 2016-04-07 LAB — CBC
HEMATOCRIT: 36.3 % (ref 36.0–46.0)
Hemoglobin: 11.3 g/dL — ABNORMAL LOW (ref 12.0–15.0)
MCH: 22.6 pg — ABNORMAL LOW (ref 26.0–34.0)
MCHC: 31.1 g/dL (ref 30.0–36.0)
MCV: 72.6 fL — AB (ref 78.0–100.0)
Platelets: 293 10*3/uL (ref 150–400)
RBC: 5 MIL/uL (ref 3.87–5.11)
RDW: 17.4 % — AB (ref 11.5–15.5)
WBC: 6.4 10*3/uL (ref 4.0–10.5)

## 2016-04-07 LAB — URINALYSIS, ROUTINE W REFLEX MICROSCOPIC
Bilirubin Urine: NEGATIVE
GLUCOSE, UA: NEGATIVE mg/dL
Hgb urine dipstick: NEGATIVE
KETONES UR: NEGATIVE mg/dL
LEUKOCYTES UA: NEGATIVE
Nitrite: NEGATIVE
PH: 5 (ref 5.0–8.0)
Protein, ur: NEGATIVE mg/dL
Specific Gravity, Urine: 1.028 (ref 1.005–1.030)

## 2016-04-07 LAB — COMPREHENSIVE METABOLIC PANEL
ALBUMIN: 3.8 g/dL (ref 3.5–5.0)
ALT: 20 U/L (ref 14–54)
AST: 23 U/L (ref 15–41)
Alkaline Phosphatase: 139 U/L — ABNORMAL HIGH (ref 38–126)
Anion gap: 6 (ref 5–15)
BUN: 19 mg/dL (ref 6–20)
CHLORIDE: 102 mmol/L (ref 101–111)
CO2: 25 mmol/L (ref 22–32)
Calcium: 8.7 mg/dL — ABNORMAL LOW (ref 8.9–10.3)
Creatinine, Ser: 0.68 mg/dL (ref 0.44–1.00)
GFR calc Af Amer: >60 mL/min (ref >60)
GFR calc non Af Amer: >60 mL/min (ref >60)
GLUCOSE: 82 mg/dL (ref 65–99)
POTASSIUM: 4.1 mmol/L (ref 3.5–5.1)
Sodium: 133 mmol/L — ABNORMAL LOW (ref 135–145)
Total Bilirubin: 0.6 mg/dL (ref 0.3–1.2)
Total Protein: 7.5 g/dL (ref 6.5–8.1)

## 2016-04-07 LAB — I-STAT BETA HCG BLOOD, ED (MC, WL, AP ONLY): I-stat hCG, quantitative: <5 m[IU]/mL (ref <5)

## 2016-04-07 LAB — LIPASE, BLOOD: LIPASE: 35 U/L (ref 11–51)

## 2016-04-07 MED ORDER — IBUPROFEN 600 MG PO TABS: 600.0000 mg | ORAL_TABLET | Freq: Four times a day (QID) | ORAL | 0 refills | Status: DC | PRN

## 2016-04-07 MED ORDER — CYCLOBENZAPRINE HCL 10 MG PO TABS: 10.0000 mg | ORAL_TABLET | Freq: Two times a day (BID) | ORAL | 0 refills | Status: DC | PRN

## 2016-04-07 NOTE — ED Triage Notes (Signed)
Triage done by this RN 

## 2016-04-07 NOTE — ED Provider Notes (Signed)
[facility]-EMER[facility]ENCY DEPT Provider Note   CSN: [id number] Arrival date & time: 04/07/16  1557  First Provider Contact:  First MD Initiated Contact with Patient 04/07/16 1706        History   Chief Complaint Chief Complaint  Patient presents with  . Abdominal Pain  . Emesis  . Shortness of Breath    HPI Tonya [name] is a 30 y.o. female.  HPI   30 year old female presenting today with complaint of right low back pain. History obtained through sister-in-law who is at bedside. Patient reports for the past 2 weeks she has had intermittent sharp stabbing pain to her right low back. Pain is nonradiating, rated as moderate in severity, improved with sitting and worsen with movement. She has been using icy hot, and massage with some relief but her symptoms has not fully resolved. She does have a history of back pain but states this pain is more severe. In pain is intense she endorse nausea without vomiting or diarrhea. There is no associated fever, chills, lightheadedness, dizziness, chest pain, shortness of breath, hemoptysis, productive cough, dysuria, hematuria vaginal bleeding or vaginal discharge. She denies any recent strenuous activities or heavy lifting that may precipitate her pain. She had a normal vaginal delivery in May without any complication. She does not have a primary care provider at this time. She recently immigrated to the [place] from [place] this past January.    Past Medical History:  Diagnosis Date  . Medical history non-contributory     Patient Active Problem List   Diagnosis Date Noted  . Post term pregnancy over 40 weeks 12/31/2015  . NSVD (normal spontaneous vaginal delivery) 12/31/2015  . Supervision of normal pregnancy 10/16/2015  . Late prenatal care affecting pregnancy, antepartum 10/16/2015  . Language barrier 10/16/2015    Past Surgical History:  Procedure Laterality Date  . NO PAST SURGERIES      OB History    Gravida Para Term Preterm AB Living   4 4  4     4    SAB TAB Ectopic Multiple Live Births           4       Home Medications    Prior to Admission medications   Medication Sig Start Date End Date Taking? Authorizing Provider  ibuprofen (ADVIL,MOTRIN) 600 MG tablet Take 1 tablet (600 mg total) by mouth every 6 (six) hours. 01/01/16   Montez Morita, CNM    Family History Family History  Problem Relation Age of Onset  . Alcohol abuse Neg Hx   . Arthritis Neg Hx   . Asthma Neg Hx   . Birth defects Neg Hx   . Cancer Neg Hx   . COPD Neg Hx   . Depression Neg Hx   . Diabetes Neg Hx   . Drug abuse Neg Hx   . Early death Neg Hx   . Hearing loss Neg Hx   . Heart disease Neg Hx   . Hyperlipidemia Neg Hx   . Hypertension Neg Hx   . Kidney disease Neg Hx   . Learning disabilities Neg Hx   . Mental illness Neg Hx   . Mental retardation Neg Hx   . Miscarriages / Stillbirths Neg Hx   . Stroke Neg Hx   . Vision loss Neg Hx   . Varicose Veins Neg Hx     Social History Social History  Substance Use Topics  . Smoking status: Never Smoker  . Smokeless tobacco: Not on file  .  Alcohol use No     Allergies   Review of patient's allergies indicates no known allergies.   Review of Systems Review of Systems  All other systems reviewed and are negative.    Physical Exam Updated Vital Signs BP 110/79 (BP Location: Right Arm)   Pulse 77   Temp 97.5 F (36.4 C) (Oral)   Resp 18   LMP 02/07/2016 (Approximate)   SpO2 100%   Breastfeeding? Yes   Physical Exam  Constitutional: She appears well-developed and well-nourished. No distress.  HENT:  Head: Atraumatic.  Eyes: Conjunctivae are normal.  Neck: Neck supple.  Cardiovascular: Normal rate, regular rhythm and intact distal pulses.   Pulmonary/Chest: Effort normal and breath sounds normal.  Abdominal: Soft. She exhibits no distension. There is no tenderness.  Musculoskeletal: She exhibits tenderness (Tenderness noted to right paralumbar spinal muscle at the level  off L2-L4 without any overlying skin changes and full range of motion. No significant midline spine tenderness crepitus or step-off. No overlying skin changes.).  Neurological: She is alert. She has normal reflexes.  Skin: No rash noted.  Psychiatric: She has a normal mood and affect.  Nursing note and vitals reviewed.    ED Treatments / Results  Labs (all labs ordered are listed, but only abnormal results are displayed) Labs Reviewed  COMPREHENSIVE METABOLIC PANEL - Abnormal; Notable for the following:       Result Value   Sodium 133 (*)    Calcium 8.7 (*)    Alkaline Phosphatase 139 (*)    All other components within normal limits  CBC - Abnormal; Notable for the following:    Hemoglobin 11.3 (*)    MCV 72.6 (*)    MCH 22.6 (*)    RDW 17.4 (*)    All other components within normal limits  LIPASE, BLOOD  URINALYSIS, ROUTINE W REFLEX MICROSCOPIC (NOT AT [facility])  I-STAT BETA HCG BLOOD, ED ([facility], [facility], [facility] ONLY)    EKG  EKG Interpretation None       Radiology Dg Chest 2 View  Result Date: 04/07/2016 CLINICAL DATA:  Right-sided chest pain and shortness of Breath EXAM: CHEST  2 VIEW COMPARISON:  None. FINDINGS: The heart size and mediastinal contours are within normal limits. Both lungs are clear. The visualized skeletal structures are unremarkable. IMPRESSION: No active cardiopulmonary disease. Electronically Signed   By: Alcide Clever M.D.   On: 04/07/2016 16:58    Procedures Procedures (including critical care time)  Medications Ordered in ED Medications - No data to display   Initial Impression / Assessment and Plan / ED Course  I have reviewed the triage vital signs and the nursing notes.  Pertinent labs & imaging results that were available during my care of the patient were reviewed by me and considered in my medical decision making (see chart for details).  Clinical Course   BP 110/79 (BP Location: Right Arm)   Pulse 77   Temp 97.5 F (36.4 C) (Oral)   Resp 18    LMP 02/07/2016 (Approximate)   SpO2 100%   Breastfeeding? Yes    Final Clinical Impressions(s) / ED Diagnoses   Final diagnoses:  Low back strain, initial encounter    New Prescriptions New Prescriptions   CYCLOBENZAPRINE (FLEXERIL) 10 MG TABLET    Take 1 tablet (10 mg total) by mouth 2 (two) times daily as needed for muscle spasms.   5:33 PM Patient presents with right lower back pain. Her pain is reproducible on exam and likely  musculoskeletal. No signs of skin infection. No significant midline spine tenderness concerning for fractures or dislocation. Her labs are reassuring. She denies having any shortness of breath or cough and I have low suspicion for pneumonia or PE. No urinary symptoms concerning for kidney infection or kidney stone. She is well-appearing and ambulate without difficulty. Plan to provide symptomatically treatment included muscle relaxant and anti-inflammatory medication. Encouraged patient to find a primary care provider for further management of her health. Return precaution discussed. History is obtained through sister-in-law who is at bedside. I did offer a professional language interpreter but patient declined.   Fayrene HelperBowie Roshni Burbano, PA-C 04/07/16 1736    Doug SouSam Jacubowitz, MD 04/08/16 [phone]

## 2016-04-07 NOTE — ED Triage Notes (Signed)
Pt family sts pt with SOB, nausea and right sided pain around rib area; pt had vaginal  Delivery in May; pt LMP was June

## 2017-02-18 ENCOUNTER — Inpatient Hospital Stay (HOSPITAL_COMMUNITY): Payer: Medicaid Other

## 2017-02-18 ENCOUNTER — Encounter (HOSPITAL_COMMUNITY): Payer: Self-pay | Admitting: *Deleted

## 2017-02-18 ENCOUNTER — Inpatient Hospital Stay (HOSPITAL_COMMUNITY)
Admission: AD | Admit: 2017-02-18 | Discharge: 2017-02-18 | Disposition: A | Discharge status: Home / Self Care | Payer: Medicaid Other | Source: Ambulatory Visit | Attending: Family Medicine | Admitting: Family Medicine

## 2017-02-18 DIAGNOSIS — O26891 Other specified pregnancy related conditions, first trimester: Secondary | ICD-10-CM

## 2017-02-18 DIAGNOSIS — O26892 Other specified pregnancy related conditions, second trimester: Secondary | ICD-10-CM | POA: Diagnosis present

## 2017-02-18 DIAGNOSIS — R109 Unspecified abdominal pain: Secondary | ICD-10-CM | POA: Insufficient documentation

## 2017-02-18 DIAGNOSIS — Z79899 Other long term (current) drug therapy: Secondary | ICD-10-CM | POA: Diagnosis not present

## 2017-02-18 DIAGNOSIS — Z3A01 Less than 8 weeks gestation of pregnancy: Secondary | ICD-10-CM | POA: Insufficient documentation

## 2017-02-18 DIAGNOSIS — O26899 Other specified pregnancy related conditions, unspecified trimester: Secondary | ICD-10-CM

## 2017-02-18 LAB — CBC
HEMATOCRIT: 34.1 % — AB (ref 36.0–46.0)
HEMOGLOBIN: 11.5 g/dL — AB (ref 12.0–15.0)
MCH: 26.7 pg (ref 26.0–34.0)
MCHC: 33.7 g/dL (ref 30.0–36.0)
MCV: 79.3 fL (ref 78.0–100.0)
Platelets: 240 10*3/uL (ref 150–400)
RBC: 4.3 MIL/uL (ref 3.87–5.11)
RDW: 15.5 % (ref 11.5–15.5)
WBC: 6.7 10*3/uL (ref 4.0–10.5)

## 2017-02-18 LAB — URINALYSIS, ROUTINE W REFLEX MICROSCOPIC
Bilirubin Urine: NEGATIVE
Glucose, UA: NEGATIVE mg/dL
Hgb urine dipstick: NEGATIVE
Ketones, ur: NEGATIVE mg/dL
LEUKOCYTES UA: NEGATIVE
NITRITE: NEGATIVE
PROTEIN: NEGATIVE mg/dL
SPECIFIC GRAVITY, URINE: 1.008 (ref 1.005–1.030)
pH: 5 (ref 5.0–8.0)

## 2017-02-18 LAB — COMPREHENSIVE METABOLIC PANEL
ALBUMIN: 4 g/dL (ref 3.5–5.0)
ALK PHOS: 80 U/L (ref 38–126)
ALT: 16 U/L (ref 14–54)
ANION GAP: 6 (ref 5–15)
AST: 17 U/L (ref 15–41)
BILIRUBIN TOTAL: 0.6 mg/dL (ref 0.3–1.2)
BUN: 11 mg/dL (ref 6–20)
CALCIUM: 8.7 mg/dL — AB (ref 8.9–10.3)
CO2: 22 mmol/L (ref 22–32)
CREATININE: 0.64 mg/dL (ref 0.44–1.00)
Chloride: 105 mmol/L (ref 101–111)
GFR calc Af Amer: >60 mL/min (ref >60)
GFR calc non Af Amer: >60 mL/min (ref >60)
GLUCOSE: 99 mg/dL (ref 65–99)
Potassium: 4 mmol/L (ref 3.5–5.1)
Sodium: 133 mmol/L — ABNORMAL LOW (ref 135–145)
TOTAL PROTEIN: 8.1 g/dL (ref 6.5–8.1)

## 2017-02-18 LAB — WET PREP, GENITAL
Clue Cells Wet Prep HPF POC: NONE SEEN
Sperm: NONE SEEN
TRICH WET PREP: NONE SEEN
Yeast Wet Prep HPF POC: NONE SEEN

## 2017-02-18 LAB — POCT PREGNANCY, URINE: PREG TEST UR: POSITIVE — AB

## 2017-02-18 LAB — HCG, QUANTITATIVE, PREGNANCY: hCG, Beta Chain, Quant, S: 20813 m[IU]/mL — ABNORMAL HIGH (ref <5)

## 2017-02-18 NOTE — MAU Note (Signed)
Pt C/O mid & lower abd pain since June 15, has become progressively worse.  LMP was May 20, pos HPT 2 days ago.  Denies bleeding.

## 2017-02-18 NOTE — Discharge Instructions (Signed)

## 2017-02-18 NOTE — MAU Provider Note (Signed)
Patient Tonya [name] is a 31 y.o. G5P4004 at [redacted]w[redacted]d  Here with abdominal pain off and on since June 15th. She denies bleeding, nausea or vomiting.  History     CSN: [id number]  Arrival date and time: 02/18/17 7829   First Provider Initiated Contact with Patient 02/18/17 2202         Chief Complaint  Patient presents with  . Abdominal Pain   Abdominal Pain  This is a new problem. The current episode started in the past 7 days. The onset quality is gradual. The problem occurs intermittently. The pain is at a severity of 6/10. The pain is moderate. Quality: stretching pain. Pertinent negatives include no constipation, diarrhea, nausea or vomiting. Exacerbated by: patient feels like it is worse when she sits for a long time or when she stands for a long time. She has tried acetaminophen for the symptoms. The treatment provided no relief.  She feels the pain at the top of her belly and also in the suprapubic region.   OB History    Gravida Para Term Preterm AB Living   4 4 4     4    SAB TAB Ectopic Multiple Live Births           4      Past Medical History:  Diagnosis Date  . Medical history non-contributory     Past Surgical History:  Procedure Laterality Date  . NO PAST SURGERIES      Family History  Problem Relation Age of Onset  . Alcohol abuse Neg Hx   . Arthritis Neg Hx   . Asthma Neg Hx   . Birth defects Neg Hx   . Cancer Neg Hx   . COPD Neg Hx   . Depression Neg Hx   . Diabetes Neg Hx   . Drug abuse Neg Hx   . Early death Neg Hx   . Hearing loss Neg Hx   . Heart disease Neg Hx   . Hyperlipidemia Neg Hx   . Hypertension Neg Hx   . Kidney disease Neg Hx   . Learning disabilities Neg Hx   . Mental illness Neg Hx   . Mental retardation Neg Hx   . Miscarriages / Stillbirths Neg Hx   . Stroke Neg Hx   . Vision loss Neg Hx   . Varicose Veins Neg Hx     Social History  Substance Use Topics  . Smoking status: Never Smoker  . Smokeless tobacco: Not on file   . Alcohol use No    Allergies: No Known Allergies  Prescriptions Prior to Admission  Medication Sig Dispense Refill Last Dose  . cyclobenzaprine (FLEXERIL) 10 MG tablet Take 1 tablet (10 mg total) by mouth 2 (two) times daily as needed for muscle spasms. 20 tablet 0   . ibuprofen (ADVIL,MOTRIN) 600 MG tablet Take 1 tablet (600 mg total) by mouth every 6 (six) hours as needed for mild pain or moderate pain. 30 tablet 0     Review of Systems  Respiratory: Negative.   Cardiovascular: Negative.   Gastrointestinal: Positive for abdominal pain. Negative for constipation, diarrhea, nausea and vomiting.  Genitourinary: Negative.   Neurological: Negative.    Charlesetta Garibaldi Kooistra 02/18/2017, 7:34 PM  Physical Exam   Blood pressure 115/64, pulse 75, temperature 98.1 F (36.7 C), temperature source Oral, resp. rate 16, height 5\' 3"  (1.6 m), weight 190 lb (86.2 kg), last menstrual period 01/12/2017, currently breastfeeding.   Patient care endorsed to [place],  PA-C at 2035.  Physical Exam  Nursing note and vitals reviewed. Constitutional: She is oriented to person, place, and time. She appears well-developed and well-nourished. No distress.  HENT:  Head: Normocephalic and atraumatic.  Cardiovascular: Normal rate.   Respiratory: Effort normal.  GI: Soft. She exhibits no distension and no mass. There is no tenderness. There is no rebound and no guarding.  Genitourinary: Uterus is not enlarged and not tender. Cervix exhibits no motion tenderness, no discharge and no friability. Right adnexum displays no mass and no tenderness. Left adnexum displays no mass and no tenderness. No bleeding in the vagina. No vaginal discharge found.  Neurological: She is alert and oriented to person, place, and time.  Skin: Skin is warm and dry. No erythema.  Psychiatric: She has a normal mood and affect.   Results for orders placed or performed during the hospital encounter of 02/18/17 (from the past 24  hour(s))  Urinalysis, Routine w reflex microscopic     Status: Abnormal   Collection Time: 02/18/17  7:04 PM  Result Value Ref Range   Color, Urine STRAW (A) YELLOW   APPearance CLEAR CLEAR   Specific Gravity, Urine 1.008 1.005 - 1.030   pH 5.0 5.0 - 8.0   Glucose, UA NEGATIVE NEGATIVE mg/dL   Hgb urine dipstick NEGATIVE NEGATIVE   Bilirubin Urine NEGATIVE NEGATIVE   Ketones, ur NEGATIVE NEGATIVE mg/dL   Protein, ur NEGATIVE NEGATIVE mg/dL   Nitrite NEGATIVE NEGATIVE   Leukocytes, UA NEGATIVE NEGATIVE  Pregnancy, urine POC     Status: Abnormal   Collection Time: 02/18/17  7:14 PM  Result Value Ref Range   Preg Test, Ur POSITIVE (A) NEGATIVE  CBC     Status: Abnormal   Collection Time: 02/18/17  7:48 PM  Result Value Ref Range   WBC 6.7 4.0 - 10.5 K/uL   RBC 4.30 3.87 - 5.11 MIL/uL   Hemoglobin 11.5 (L) 12.0 - 15.0 g/dL   HCT 95.634.1 (L) 21.336.0 - 08.646.0 %   MCV 79.3 78.0 - 100.0 fL   MCH 26.7 26.0 - 34.0 pg   MCHC 33.7 30.0 - 36.0 g/dL   RDW 57.815.5 46.911.5 - 62.915.5 %   Platelets 240 150 - 400 K/uL  hCG, quantitative, pregnancy     Status: Abnormal   Collection Time: 02/18/17  7:48 PM  Result Value Ref Range   hCG, Beta Chain, Quant, S 20,813 (H) <5 mIU/mL  Comprehensive metabolic panel     Status: Abnormal   Collection Time: 02/18/17  7:48 PM  Result Value Ref Range   Sodium 133 (L) 135 - 145 mmol/L   Potassium 4.0 3.5 - 5.1 mmol/L   Chloride 105 101 - 111 mmol/L   CO2 22 22 - 32 mmol/L   Glucose, Bld 99 65 - 99 mg/dL   BUN 11 6 - 20 mg/dL   Creatinine, Ser 5.280.64 0.44 - 1.00 mg/dL   Calcium 8.7 (L) 8.9 - 10.3 mg/dL   Total Protein 8.1 6.5 - 8.1 g/dL   Albumin 4.0 3.5 - 5.0 g/dL   AST 17 15 - 41 U/L   ALT 16 14 - 54 U/L   Alkaline Phosphatase 80 38 - 126 U/L   Total Bilirubin 0.6 0.3 - 1.2 mg/dL   GFR calc non Af Amer >60 >60 mL/min   GFR calc Af Amer >60 >60 mL/min   Anion gap 6 5 - 15  Wet prep, genital     Status: Abnormal   Collection Time:  02/18/17 10:13 PM  Result Value  Ref Range   Yeast Wet Prep HPF POC NONE SEEN NONE SEEN   Trich, Wet Prep NONE SEEN NONE SEEN   Clue Cells Wet Prep HPF POC NONE SEEN NONE SEEN   WBC, Wet Prep HPF POC FEW (A) NONE SEEN   Sperm NONE SEEN    US Ob Comp Less 14 Wks  Result Date: 02/18/2017 CLINICAL DATA:  Initial evaluation for acute mid/ lower abdominal pain since 02/07/2017, increasing. Currently pregnant. Beta HCG equals 20,813. EXAM: OBSTETRIC <14 WK [place] AND TRANSVAGINAL OB US TECHNIQUE: Both transabdominal and transvaginal ultrasound examinations were performed for complete evaluation of the gestation as well as the maternal uterus, adnexal regions, and pelvic cul-de-sac. Transvaginal technique was performed to assess early pregnancy. COMPARISON:  None available. FINDINGS: Intrauterine gestational sac: Single Yolk sac:  Present Embryo:  Present Cardiac Activity: Present Heart Rate: 94  bpm CRL:  2.2  mm   5 w   5 d                  [place] EDC: 01/13/2018. Subchorionic hemorrhage: Possible tiny subchorionic hemorrhage without associated mass effect. Maternal uterus/adnexae: Right ovary within normal limits. Probable corpus luteal cyst noted within the left ovary. No adnexal mass. Trace free fluid within the pelvis. IMPRESSION: 1. Single viable intrauterine pregnancy as above. 2. Possible tiny subchorionic hemorrhage without associated mass effect. 3. Left ovarian corpus luteal cyst with trace free fluid within the pelvis. 4. No other acute abnormality within the pelvis. Electronically Signed   By: Rise Mu M.D.   On: 02/18/2017 22:08   US Ob Transvaginal  Result Date: 02/18/2017 CLINICAL DATA:  Initial evaluation for acute mid/ lower abdominal pain since 02/07/2017, increasing. Currently pregnant. Beta HCG equals 20,813. EXAM: OBSTETRIC <14 WK [place] AND TRANSVAGINAL OB US TECHNIQUE: Both transabdominal and transvaginal ultrasound examinations were performed for complete evaluation of the gestation as well as the maternal uterus,  adnexal regions, and pelvic cul-de-sac. Transvaginal technique was performed to assess early pregnancy. COMPARISON:  None available. FINDINGS: Intrauterine gestational sac: Single Yolk sac:  Present Embryo:  Present Cardiac Activity: Present Heart Rate: 94  bpm CRL:  2.2  mm   5 w   5 d                  [place] EDC: 01/13/2018. Subchorionic hemorrhage: Possible tiny subchorionic hemorrhage without associated mass effect. Maternal uterus/adnexae: Right ovary within normal limits. Probable corpus luteal cyst noted within the left ovary. No adnexal mass. Trace free fluid within the pelvis. IMPRESSION: 1. Single viable intrauterine pregnancy as above. 2. Possible tiny subchorionic hemorrhage without associated mass effect. 3. Left ovarian corpus luteal cyst with trace free fluid within the pelvis. 4. No other acute abnormality within the pelvis. Electronically Signed   By: Rise Mu M.D.   On: 02/18/2017 22:08    MAU Course  Procedures None  MDM +UPT UA, wet prep, GC/chlamydia, CBC, quant hCG, HIV, RPR and [place] today to rule out ectopic pregnancy B+ blood type in Epic from previous visit No pain endorsed at time of evaluation Assessment and Plan  A: SIUP at [redacted]w[redacted]d Possible tiny subchorionic hemorrhage Abdominal pain in pregnancy, first trimester  P: Discharge home Tylenol PRN for pain First rimester precautions discussed Patient advised to follow-up with OB provider of choice Pregnancy confirmation letter given with list of OB providers Patient may return to MAU as needed or if her condition were to change or  worsen  Kathlene Cote  02/18/2017 10:21 PM

## 2017-02-19 LAB — GC/CHLAMYDIA PROBE AMP (~~LOC~~) NOT AT ARMC
Chlamydia: NEGATIVE
Neisseria Gonorrhea: NEGATIVE

## 2017-08-26 NOTE — L&D Delivery Note (Signed)
Patient is a 32 y.o. now G5P5 s/p NSVD at 6570w1d, who was admitted for SOL and DFM.  She progressed without augmentation to complete and pushed to deliver. She was not adequately treated for GBS prior to rapid delivery, she received one dose of PCN. Cord clamping delayed by several minutes then clamped by CNM and cut by FOB. She plans on breastfeeding. She requests nothing for birth control- fully counseled. Patient verbalizes understanding.   Delivery Note At 3:58 AM a viable female was delivered via Vaginal, Spontaneous (Presentation: ROA ).  APGAR: 9,9 ; weight pending.   Placenta intact and spontaneous, bleeding moderate, 3V Cord: with no delivery complications.   Anesthesia: Nitrous  Episiotomy: None Lacerations: 1st degree;Vaginal Suture Repair: None- not repaired, hemostatic  Est. Blood Loss (mL):  100mL  Mom and baby stable prior to transfer to postpartum.  Baby to Couplet care / Skin to Skin.  Sharyon CableVeronica C Kazue Cerro CNM 10/20/2017, 4:20 AM

## 2017-10-20 ENCOUNTER — Encounter (HOSPITAL_COMMUNITY): Payer: Self-pay

## 2017-10-20 ENCOUNTER — Encounter (HOSPITAL_COMMUNITY): Payer: Self-pay | Admitting: Anesthesiology

## 2017-10-20 ENCOUNTER — Inpatient Hospital Stay (HOSPITAL_COMMUNITY)
Admission: AD | Admit: 2017-10-20 | Discharge: 2017-10-22 | DRG: 807 | Disposition: A | Discharge status: Home / Self Care | Payer: Medicaid Other | Source: Ambulatory Visit | Attending: Obstetrics and Gynecology | Admitting: Obstetrics and Gynecology

## 2017-10-20 DIAGNOSIS — O368130 Decreased fetal movements, third trimester, not applicable or unspecified: Principal | ICD-10-CM | POA: Diagnosis present

## 2017-10-20 DIAGNOSIS — O99824 Streptococcus B carrier state complicating childbirth: Secondary | ICD-10-CM | POA: Diagnosis present

## 2017-10-20 DIAGNOSIS — Z3A40 40 weeks gestation of pregnancy: Secondary | ICD-10-CM

## 2017-10-20 LAB — COMPREHENSIVE METABOLIC PANEL
ALT: 14 U/L (ref 14–54)
AST: 22 U/L (ref 15–41)
Albumin: 3.1 g/dL — ABNORMAL LOW (ref 3.5–5.0)
Alkaline Phosphatase: 190 U/L — ABNORMAL HIGH (ref 38–126)
Anion gap: 8 (ref 5–15)
BUN: 11 mg/dL (ref 6–20)
CO2: 19 mmol/L — ABNORMAL LOW (ref 22–32)
Calcium: 8.6 mg/dL — ABNORMAL LOW (ref 8.9–10.3)
Chloride: 106 mmol/L (ref 101–111)
Creatinine, Ser: 0.62 mg/dL (ref 0.44–1.00)
GFR calc Af Amer: >60 mL/min (ref >60)
GFR calc non Af Amer: >60 mL/min (ref >60)
Glucose, Bld: 83 mg/dL (ref 65–99)
Potassium: 4.1 mmol/L (ref 3.5–5.1)
Sodium: 133 mmol/L — ABNORMAL LOW (ref 135–145)
Total Bilirubin: 0.7 mg/dL (ref 0.3–1.2)
Total Protein: 7.1 g/dL (ref 6.5–8.1)

## 2017-10-20 LAB — CBC
HCT: 32.2 % — ABNORMAL LOW (ref 36.0–46.0)
Hemoglobin: 10.4 g/dL — ABNORMAL LOW (ref 12.0–15.0)
MCH: 22.9 pg — ABNORMAL LOW (ref 26.0–34.0)
MCHC: 32.3 g/dL (ref 30.0–36.0)
MCV: 70.9 fL — ABNORMAL LOW (ref 78.0–100.0)
Platelets: 192 10*3/uL (ref 150–400)
RBC: 4.54 MIL/uL (ref 3.87–5.11)
RDW: 18.7 % — ABNORMAL HIGH (ref 11.5–15.5)
WBC: 9.8 10*3/uL (ref 4.0–10.5)

## 2017-10-20 LAB — TYPE AND SCREEN
ABO/RH(D): B POS
Antibody Screen: NEGATIVE

## 2017-10-20 LAB — RPR: RPR Ser Ql: NONREACTIVE

## 2017-10-20 MED ORDER — PENICILLIN G POT IN DEXTROSE 60000 UNIT/ML IV SOLN
3.0000 10*6.[IU] | INTRAVENOUS | Status: DC
  Filled 2017-10-20 (×3): qty 50

## 2017-10-20 MED ORDER — SODIUM CHLORIDE 0.9 % IV SOLN
5.0000 10*6.[IU] | Freq: Once | INTRAVENOUS | Status: AC
  Administered 2017-10-20: 5 10*6.[IU] via INTRAVENOUS
  Filled 2017-10-20: qty 5

## 2017-10-20 MED ORDER — ACETAMINOPHEN 325 MG PO TABS: 650.0000 mg | ORAL_TABLET | ORAL | Status: DC | PRN

## 2017-10-20 MED ORDER — OXYTOCIN 40 UNITS IN LACTATED RINGERS INFUSION - SIMPLE MED
2.5000 [IU]/h | INTRAVENOUS | Status: DC
  Filled 2017-10-20: qty 1000

## 2017-10-20 MED ORDER — OXYCODONE-ACETAMINOPHEN 5-325 MG PO TABS: 2.0000 | ORAL_TABLET | ORAL | Status: DC | PRN

## 2017-10-20 MED ORDER — OXYCODONE-ACETAMINOPHEN 5-325 MG PO TABS: 1.0000 | ORAL_TABLET | ORAL | Status: DC | PRN

## 2017-10-20 MED ORDER — SENNOSIDES-DOCUSATE SODIUM 8.6-50 MG PO TABS
2.0000 | ORAL_TABLET | ORAL | Status: DC
  Administered 2017-10-20 – 2017-10-21 (×2): 2 via ORAL
  Filled 2017-10-20 (×2): qty 2

## 2017-10-20 MED ORDER — ZOLPIDEM TARTRATE 5 MG PO TABS: 5.0000 mg | ORAL_TABLET | Freq: Every evening | ORAL | Status: DC | PRN

## 2017-10-20 MED ORDER — COCONUT OIL OIL: 1.0000 "application " | TOPICAL_OIL | Status: DC | PRN

## 2017-10-20 MED ORDER — ONDANSETRON HCL 4 MG PO TABS: 4.0000 mg | ORAL_TABLET | ORAL | Status: DC | PRN

## 2017-10-20 MED ORDER — TETANUS-DIPHTH-ACELL PERTUSSIS 5-2.5-18.5 LF-MCG/0.5 IM SUSP: 0.5000 mL | Freq: Once | INTRAMUSCULAR | Status: DC

## 2017-10-20 MED ORDER — ONDANSETRON HCL 4 MG/2ML IJ SOLN: 4.0000 mg | Freq: Four times a day (QID) | INTRAMUSCULAR | Status: DC | PRN

## 2017-10-20 MED ORDER — SOD CITRATE-CITRIC ACID 500-334 MG/5ML PO SOLN: 30.0000 mL | ORAL | Status: DC | PRN

## 2017-10-20 MED ORDER — ONDANSETRON HCL 4 MG/2ML IJ SOLN: 4.0000 mg | INTRAMUSCULAR | Status: DC | PRN

## 2017-10-20 MED ORDER — SIMETHICONE 80 MG PO CHEW: 80.0000 mg | CHEWABLE_TABLET | ORAL | Status: DC | PRN

## 2017-10-20 MED ORDER — OXYTOCIN BOLUS FROM INFUSION: 500.0000 mL | Freq: Once | INTRAVENOUS | Status: DC

## 2017-10-20 MED ORDER — PRENATAL MULTIVITAMIN CH
1.0000 | ORAL_TABLET | Freq: Every day | ORAL | Status: DC
  Administered 2017-10-20 – 2017-10-22 (×3): 1 via ORAL
  Filled 2017-10-20 (×3): qty 1

## 2017-10-20 MED ORDER — LACTATED RINGERS IV SOLN: 500.0000 mL | INTRAVENOUS | Status: DC | PRN

## 2017-10-20 MED ORDER — LACTATED RINGERS IV SOLN
INTRAVENOUS | Status: DC
  Administered 2017-10-20: 02:00:00 via INTRAVENOUS

## 2017-10-20 MED ORDER — DIBUCAINE 1 % RE OINT: 1.0000 "application " | TOPICAL_OINTMENT | RECTAL | Status: DC | PRN

## 2017-10-20 MED ORDER — FENTANYL CITRATE (PF) 100 MCG/2ML IJ SOLN: 100.0000 ug | INTRAMUSCULAR | Status: DC | PRN

## 2017-10-20 MED ORDER — IBUPROFEN 600 MG PO TABS
600.0000 mg | ORAL_TABLET | Freq: Four times a day (QID) | ORAL | Status: DC
  Administered 2017-10-20 – 2017-10-22 (×9): 600 mg via ORAL
  Filled 2017-10-20 (×9): qty 1

## 2017-10-20 MED ORDER — WITCH HAZEL-GLYCERIN EX PADS: 1.0000 "application " | MEDICATED_PAD | CUTANEOUS | Status: DC | PRN

## 2017-10-20 MED ORDER — BENZOCAINE-MENTHOL 20-0.5 % EX AERO: 1.0000 "application " | INHALATION_SPRAY | CUTANEOUS | Status: DC | PRN

## 2017-10-20 MED ORDER — DIPHENHYDRAMINE HCL 25 MG PO CAPS: 25.0000 mg | ORAL_CAPSULE | Freq: Four times a day (QID) | ORAL | Status: DC | PRN

## 2017-10-20 MED ORDER — LIDOCAINE HCL (PF) 1 % IJ SOLN
30.0000 mL | INTRAMUSCULAR | Status: DC | PRN
  Filled 2017-10-20: qty 30

## 2017-10-20 NOTE — Lactation Note (Signed)
This note was copied from a baby's chart. Lactation Consultation Note  Patient Name: Girl Hurshel KeysHalimo Cadenhead UJWJX'BToday's Date: 10/20/2017 Reason for consult: Initial assessment;Term  Checked in with P5 Mom, at 5018 hrs old.  Baby has breastfed 7 times with latch scores of 8-10.   Mom on phone, but FOB answering questions.  Room dark and both lying down in bed and couch.  Mom doing fine with breastfeeding and needs no help.  Lactation brochure left in room.  Explained that assistance was available if needed and encouraged to call prn.   Maternal Data Formula Feeding for Exclusion: No  Feeding Feeding Type: Breast Fed Length of feed: 20 min  LATCH Score Latch: Grasps breast easily, tongue down, lips flanged, rhythmical sucking.  Audible Swallowing: Spontaneous and intermittent  Type of Nipple: Everted at rest and after stimulation  Comfort (Breast/Nipple): Soft / non-tender  Hold (Positioning): No assistance needed to correctly position infant at breast.  LATCH Score: 10  IConsult Status: Follow-up Date: 10/21/17 Follow-up type: In-patient    Judee ClaraSmith, Jakiera Ehler E 10/20/2017, 10:51 PM

## 2017-10-20 NOTE — H&P (Signed)
LABOR AND DELIVERY ADMISSION HISTORY AND PHYSICAL NOTE  Tonya Rene PaciHussein is a 32 y.o. female [phone] with IUP at 7181w1d by LMP who presents to MAU with decreased fetal movement and contractions. She reports that she has not felt baby move as much since around "lunch on 10/19/17". She reports the contractions started around 5pm with some vaginal bleeding. She denies timing contractions or knowing how far apart they are. She denies leakage of fluid. Husband at bedside to interpret per patient consent.   [facility] Pregnancy complications:  - Decreased fetal movement in the third trimester  -Hypertension in the third trimester, Pre-E labs pending  -late to prenatal care   Past Medical History: Past Medical History:  Diagnosis Date  . Medical history non-contributory     Past Surgical History: Past Surgical History:  Procedure Laterality Date  . NO PAST SURGERIES      Obstetrical History: OB History    Gravida Para Term Preterm AB Living   5 4 4     4    SAB TAB Ectopic Multiple Live Births           4      Social History: Social History   Socioeconomic History  . Marital status: Married    Spouse name: None  . Number of children: None  . Years of education: None  . Highest education level: None  Social Needs  . Financial resource strain: None  . Food insecurity - worry: None  . Food insecurity - inability: None  . Transportation needs - medical: None  . Transportation needs - non-medical: None  Occupational History  . None  Tobacco Use  . Smoking status: Never Smoker  Substance and Sexual Activity  . Alcohol use: No  . Drug use: No  . Sexual activity: Yes  Other Topics Concern  . None  Social History Narrative  . None    Family History: Family History  Problem Relation Age of Onset  . Alcohol abuse Neg Hx   . Arthritis Neg Hx   . Asthma Neg Hx   . Birth defects Neg Hx   . Cancer Neg Hx   . COPD Neg Hx   .  Depression Neg Hx   . Diabetes Neg Hx   . Drug abuse Neg Hx   . Early death Neg Hx   . Hearing loss Neg Hx   . Heart disease Neg Hx   . Hyperlipidemia Neg Hx   . Hypertension Neg Hx   . Kidney disease Neg Hx   . Learning disabilities Neg Hx   . Mental illness Neg Hx   . Mental retardation Neg Hx   . Miscarriages / Stillbirths Neg Hx   . Stroke Neg Hx   . Vision loss Neg Hx   . Varicose Veins Neg Hx     Allergies: No Known Allergies  Medications Prior to Admission  Medication Sig Dispense Refill Last Dose  . Prenatal Vit-Fe Fumarate-FA (MULTIVITAMIN-PRENATAL) 27-0.8 MG TABS tablet Take 1 tablet by mouth daily at 12 noon.   Past Week at Unknown time     Review of Systems  All systems reviewed and negative except as stated in HPI  Physical Exam Blood pressure 134/80, pulse 77, temperature 97.9 F (36.6 C), temperature source Oral, resp. rate 18, height 5\' 3"  (1.6 m), weight 201 lb (91.2 kg), last menstrual period 01/12/2017, SpO2 100 %, currently breastfeeding. General appearance: alert, cooperative and no distress Lungs: clear to auscultation  bilaterally Heart: regular rate and rhythm Abdomen: soft, non-tender; bowel sounds normal Extremities: No calf swelling or tenderness Presentation: cephalic by cervical exam  Fetal monitoring: 135/ minimal-mod/ no accelerations/ no deceleration  Uterine activity: 2-3/ moderate by palpation  Dilation: 4.5 Effacement (%): 90 Station: -3 Exam by:: Elie Confer RN  Prenatal labs: ABO, Rh:  B Pos  Antibody:  Negative Rubella:  Immune  RPR:   Non Reactive  HBsAg:   Negative HIV:   Non reactive  GC/Chlamydia: negative GBS:   Positive  1 hr Glucola: failed at 140. 3hr GTT 139/126/104 Genetic screening:  Late to care Anatomy US: Normal Female   Clinic [facility] Prenatal Labs  Dating LMP Blood type:   Bpos  Genetic Screen Too late Antibody: neg  Anatomic US WNL  Rubella:  Immune  GTT  Third trimester: 86 RPR:   NR  Flu vaccine declines  HBsAg:   Neg  TDaP vaccine 10/16/15                                HIV:   NR  Baby Food  Breast                                          GBS: Pos  Contraception  Pap:WNL  Circumcision  n/a female CF neg  Pediatrician  [facility] Normal Hgb pattern  Support Person  Music therapist (husband)     Prenatal Transfer Tool  Maternal Diabetes: No Genetic Screening: Declined Maternal Ultrasounds/Referrals: Normal Fetal Ultrasounds or other Referrals:  None Maternal Substance Abuse:  No Significant Maternal Medications:  None Significant Maternal Lab Results: Lab values include: Group B Strep positive  Results for orders placed or performed during the hospital encounter of 10/20/17 (from the past 24 hour(s))  CBC   Collection Time: 10/20/17  1:30 AM  Result Value Ref Range   WBC 9.8 4.0 - 10.5 K/uL   RBC 4.54 3.87 - 5.11 MIL/uL   Hemoglobin 10.4 (L) 12.0 - 15.0 g/dL   HCT 16.1 (L) 09.6 - 04.5 %   MCV 70.9 (L) 78.0 - 100.0 fL   MCH 22.9 (L) 26.0 - 34.0 pg   MCHC 32.3 30.0 - 36.0 g/dL   RDW 40.9 (H) 81.1 - 91.4 %   Platelets 192 150 - 400 K/uL  Comprehensive metabolic panel   Collection Time: 10/20/17  1:30 AM  Result Value Ref Range   Sodium 133 (L) 135 - 145 mmol/L   Potassium 4.1 3.5 - 5.1 mmol/L   Chloride 106 101 - 111 mmol/L   CO2 19 (L) 22 - 32 mmol/L   Glucose, Bld 83 65 - 99 mg/dL   BUN 11 6 - 20 mg/dL   Creatinine, Ser 7.82 0.44 - 1.00 mg/dL   Calcium 8.6 (L) 8.9 - 10.3 mg/dL   Total Protein 7.1 6.5 - 8.1 g/dL   Albumin 3.1 (L) 3.5 - 5.0 g/dL   AST 22 15 - 41 U/L   ALT 14 14 - 54 U/L   Alkaline Phosphatase 190 (H) 38 - 126 U/L   Total Bilirubin 0.7 0.3 - 1.2 mg/dL   GFR calc non Af Amer >60 >60 mL/min   GFR calc Af Amer >60 >60 mL/min   Anion gap 8 5 - 15    Patient Active Problem List  Diagnosis Date Noted  . Normal labor 10/20/2017  . Post term pregnancy over 40 weeks 12/31/2015  . NSVD (normal spontaneous vaginal delivery) 12/31/2015  . Supervision of normal  pregnancy 10/16/2015  . Late prenatal care affecting pregnancy, antepartum 10/16/2015  . Language barrier 10/16/2015    Assessment: Tonya [name] is a 32 y.o. G5P4004 at [redacted]w[redacted]d here for decreased fetal movement and SOL.   #Labor: Expectant management. Possible augmentation with pitocin if stalled progression.  #Pain: Plans natural delivery. Medication ordered PRN  #FWB: Cat 2 #ID:  GBS Pos #MOF: Breast  #MOC:None- counseled  #Circ:  n/a  Sharyon Cable, CNM 10/20/2017, 2:15 AM

## 2017-10-20 NOTE — Anesthesia Preprocedure Evaluation (Deleted)
Anesthesia Evaluation Anesthesia Physical Anesthesia Plan Anesthesia Quick Evaluation  

## 2017-10-20 NOTE — Progress Notes (Signed)
LABOR PROGRESS NOTE  Tonya [name] is a 32 y.o. G5P4004 at 2883w1d  admitted for SOL with decreased fetal movement and hypertension in MAU.   Subjective: Patient states she is feeling more pressure in her bottom, like she has to use the restroom   Objective: BP 134/80 (BP Location: Right Arm)   Pulse 77   Temp 97.9 F (36.6 C) (Oral)   Resp 18   Ht 5\' 3"  (1.6 m)   Wt 201 lb (91.2 kg)   LMP 01/12/2017   SpO2 100%   BMI 35.61 kg/m  or  Vitals:   10/20/17 0101 10/20/17 0104 10/20/17 0110 10/20/17 0153  BP: (!) 138/91  119/61 134/80  Pulse: 80  76 77  Resp: 18   18  Temp: 98.6 F (37 C)   97.9 F (36.6 C)  TempSrc: Oral   Oral  SpO2:  99%  100%  Weight: 201 lb (91.2 kg)     Height: 5\' 3"  (1.6 m)        Dilation: 6 Effacement (%): 90 Station: -2 Presentation: Vertex Exam by:: Rosea Dory, cnm  FHT: baseline rate 130, moderate varibility, +acel, no decel Toco: 2-3/ moderate  Labs: Lab Results  Component Value Date   WBC 9.8 10/20/2017   HGB 10.4 (L) 10/20/2017   HCT 32.2 (L) 10/20/2017   MCV 70.9 (L) 10/20/2017   PLT 192 10/20/2017    Patient Active Problem List   Diagnosis Date Noted  . Normal labor 10/20/2017  . Post term pregnancy over 40 weeks 12/31/2015  . NSVD (normal spontaneous vaginal delivery) 12/31/2015  . Supervision of normal pregnancy 10/16/2015  . Late prenatal care affecting pregnancy, antepartum 10/16/2015  . Language barrier 10/16/2015    Assessment / Plan: 32 y.o. G5P4004 at 6483w1d here for SOL with decreased fetal movement.   Labor: progressing well. Expectant management. Recheck cervix with second dose of antibiotic for GBS around 0600.  Fetal Wellbeing:  Cat 1 Pain Control:  Plans natural delivery  Anticipated MOD:  SVD  Sharyon CableRogers, Silvia Markuson C, CNM 10/20/2017, 3:25 AM

## 2017-10-20 NOTE — MAU Note (Addendum)
Pt here with c/o contractions and some vaginal bleeding. Reports decreased fetal movement.

## 2017-10-20 NOTE — Progress Notes (Signed)
FOB has signed interpreter forms but during admission was not fully engaged as the interpreter. FOB wanted RN to give him admission information only. RN educated FOB about importance of interpreting and signing interpreting forms.

## 2017-10-21 NOTE — Progress Notes (Signed)
Post Partum Day 1 Subjective: no complaints, up ad lib and voiding  Objective: Blood pressure (!) 109/56, pulse 68, temperature 97.9 F (36.6 C), resp. rate 18, height 5\' 3"  (1.6 m), weight 81.7 kg (180 lb 0.6 oz), last menstrual period 01/12/2017, SpO2 100 %, unknown if currently breastfeeding.  Physical Exam:  General: alert, cooperative and appears stated age Lochia: appropriate Uterine Fundus: firm Incision: n/a DVT Evaluation: No evidence of DVT seen on physical exam. No cords or calf tenderness.  Recent Labs    10/20/17 0130  HGB 10.4*  HCT 32.2*    Assessment/Plan: Plan for discharge tomorrow and Breastfeeding  GBS inadequately treated, baby likely 48hr dc.  Parents again decline birth control.    LOS: 1 day   Loni MuseKate Timberlake 10/21/2017, 6:46 AM

## 2017-10-22 NOTE — Discharge Instructions (Signed)
Vaginal Delivery, Care After °Refer to this sheet in the next few weeks. These instructions provide you with information about caring for yourself after vaginal delivery. Your health care provider may also give you more specific instructions. Your treatment has been planned according to current medical practices, but problems sometimes occur. Call your health care provider if you have any problems or questions. °What can I expect after the procedure? °After vaginal delivery, it is common to have: °· Some bleeding from your vagina. °· Soreness in your abdomen, your vagina, and the area of skin between your vaginal opening and your anus (perineum). °· Pelvic cramps. °· Fatigue. ° °Follow these instructions at home: °Medicines °· Take over-the-counter and prescription medicines only as told by your health care provider. °· If you were prescribed an antibiotic medicine, take it as told by your health care provider. Do not stop taking the antibiotic until it is finished. °Driving ° °· Do not drive or operate heavy machinery while taking prescription pain medicine. °· Do not drive for 24 hours if you received a sedative. °Lifestyle °· Do not drink alcohol. This is especially important if you are breastfeeding or taking medicine to relieve pain. °· Do not use tobacco products, including cigarettes, chewing tobacco, or e-cigarettes. If you need help quitting, ask your health care provider. °Eating and drinking °· Drink at least 8 eight-ounce glasses of water every day unless you are told not to by your health care provider. If you choose to breastfeed your baby, you may need to drink more water than this. °· Eat high-fiber foods every day. These foods may help prevent or relieve constipation. High-fiber foods include: °? Whole grain cereals and breads. °? Brown rice. °? Beans. °? Fresh fruits and vegetables. °Activity °· Return to your normal activities as told by your health care provider. Ask your health care provider  what activities are safe for you. °· Rest as much as possible. Try to rest or take a nap when your baby is sleeping. °· Do not lift anything that is heavier than your baby or 10 lb (4.5 kg) until your health care provider says that it is safe. °· Talk with your health care provider about when you can engage in sexual activity. This may depend on your: °? Risk of infection. °? Rate of healing. °? Comfort and desire to engage in sexual activity. °Vaginal Care °· If you have an episiotomy or a vaginal tear, check the area every day for signs of infection. Check for: °? More redness, swelling, or pain. °? More fluid or blood. °? Warmth. °? Pus or a bad smell. °· Do not use tampons or douches until your health care provider says this is safe. °· Watch for any blood clots that may pass from your vagina. These may look like clumps of dark red, brown, or black discharge. °General instructions °· Keep your perineum clean and dry as told by your health care provider. °· Wear loose, comfortable clothing. °· Wipe from front to back when you use the toilet. °· Ask your health care provider if you can shower or take a bath. If you had an episiotomy or a perineal tear during labor and delivery, your health care provider may tell you not to take baths for a certain length of time. °· Wear a bra that supports your breasts and fits you well. °· If possible, have someone help you with household activities and help care for your baby for at least a few days after   you leave the hospital. °· Keep all follow-up visits for you and your baby as told by your health care provider. This is important. °Contact a health care provider if: °· You have: °? Vaginal discharge that has a bad smell. °? Difficulty urinating. °? Pain when urinating. °? A sudden increase or decrease in the frequency of your bowel movements. °? More redness, swelling, or pain around your episiotomy or vaginal tear. °? More fluid or blood coming from your episiotomy or  vaginal tear. °? Pus or a bad smell coming from your episiotomy or vaginal tear. °? A fever. °? A rash. °? Little or no interest in activities you used to enjoy. °? Questions about caring for yourself or your baby. °· Your episiotomy or vaginal tear feels warm to the touch. °· Your episiotomy or vaginal tear is separating or does not appear to be healing. °· Your breasts are painful, hard, or turn red. °· You feel unusually sad or worried. °· You feel nauseous or you vomit. °· You pass large blood clots from your vagina. If you pass a blood clot from your vagina, save it to show to your health care provider. Do not flush blood clots down the toilet without having your health care provider look at them. °· You urinate more than usual. °· You are dizzy or light-headed. °· You have not breastfed at all and you have not had a menstrual period for 12 weeks after delivery. °· You have stopped breastfeeding and you have not had a menstrual period for 12 weeks after you stopped breastfeeding. °Get help right away if: °· You have: °? Pain that does not go away or does not get better with medicine. °? Chest pain. °? Difficulty breathing. °? Blurred vision or spots in your vision. °? Thoughts about hurting yourself or your baby. °· You develop pain in your abdomen or in one of your legs. °· You develop a severe headache. °· You faint. °· You bleed from your vagina so much that you fill two sanitary pads in one hour. °This information is not intended to replace advice given to you by your health care provider. Make sure you discuss any questions you have with your health care provider. °Document Released: 08/09/2000 Document Revised: 01/24/2016 Document Reviewed: 08/27/2015 °Elsevier Interactive Patient Education © 2018 [employer] ° °

## 2017-10-22 NOTE — Discharge Summary (Signed)
OB Discharge Summary     Patient Name: Tonya [name] DOB: 06/02/1986 MRN: [id number]  Date of admission: 10/20/2017 Delivering MD: Sharyon CableOGERS, VERONICA C   Date of discharge: 10/22/2017  Admitting diagnosis: 40.3 WEEKS CTX BLEEDING Intrauterine pregnancy: 6016w1d     Secondary diagnosis:  Active Problems:   Normal labor  [facility]     Discharge diagnosis: Term Pregnancy Delivered                                                                                                Post partum procedures:none  Augmentation: none  Complications: None  Hospital course:  Onset of Labor With Vaginal Delivery     32 y.o. yo G5P5005 at 4016w1d was admitted in Active Labor on 10/20/2017. Patient had an uncomplicated labor course as follows:  Membrane Rupture Time/Date: 3:56 AM ,10/20/2017   Intrapartum Procedures: Episiotomy: None [1]                                         Lacerations:  1st degree [2];Vaginal [6]  Patient had a delivery of a Viable infant. 10/20/2017  Information for the patient's newborn:  Rene PaciHussein, Girl [place] [[id number]][[id number]]  Delivery Method: Vaginal, Spontaneous(Filed from Delivery Summary)    Pateint had an uncomplicated postpartum course.  She is ambulating, tolerating a regular diet, passing flatus, and urinating well. Patient is discharged home in stable condition on 10/22/17.   Physical exam  Vitals:   10/20/17 1902 10/21/17 0549 10/21/17 1745 10/22/17 0534  BP: 125/82 (!) 109/56 113/71 (!) 110/55  Pulse: 60 68 74 81  Resp: 18 18 18 18   Temp: 98 F (36.7 C) 97.9 F (36.6 C) 98.5 F (36.9 C) 98.1 F (36.7 C)  TempSrc: Oral  Oral Oral  SpO2: 100% 100%    Weight:  81.7 kg (180 lb 0.6 oz)  82.2 kg (181 lb 3.2 oz)  Height:       General: alert, cooperative and no distress Lochia: appropriate Uterine Fundus: firm Incision: N/A DVT Evaluation: No evidence of DVT seen on physical exam. No cords or calf tenderness. Labs: Lab  Results  Component Value Date   WBC 9.8 10/20/2017   HGB 10.4 (L) 10/20/2017   HCT 32.2 (L) 10/20/2017   MCV 70.9 (L) 10/20/2017   PLT 192 10/20/2017   CMP Latest Ref Rng & Units 10/20/2017  Glucose 65 - 99 mg/dL 83  BUN 6 - 20 mg/dL 11  Creatinine 7.820.44 - 9.561.00 mg/dL 2.130.62  Sodium 086135 - 578145 mmol/L 133(L)  Potassium 3.5 - 5.1 mmol/L 4.1  Chloride 101 - 111 mmol/L 106  CO2 22 - 32 mmol/L 19(L)  Calcium 8.9 - 10.3 mg/dL 4.6(N8.6(L)  Total Protein 6.5 - 8.1 g/dL 7.1  Total Bilirubin 0.3 - 1.2 mg/dL 0.7  Alkaline Phos 38 - 126 U/L 190(H)  AST 15 - 41 U/L 22  ALT 14 - 54 U/L 14    Discharge instruction: per After Visit Summary and "Baby and  Me Booklet".  After visit meds:  Allergies as of 10/22/2017   No Known Allergies     Medication List    TAKE these medications   acetaminophen 500 MG tablet Commonly known as:  TYLENOL Take 1,000 mg by mouth every 6 (six) hours as needed for moderate pain or headache.   multivitamin-prenatal 27-0.8 MG Tabs tablet Take 1 tablet by mouth daily at 12 noon.       Diet: home with mother  Activity: Advance as tolerated. Pelvic rest for 6 weeks.   Outpatient follow up:4 weeks Follow up Appt:No future appointments. Follow up Visit:No Follow-up on file.  Postpartum contraception: None  Newborn Data: Live born female  Birth Weight: 7 lb 8.5 oz (3415 g) APGAR: 9, 9  Newborn Delivery   Birth date/time:  10/20/2017 03:58:00 Delivery type:  Vaginal, Spontaneous     Baby Feeding: Breast Disposition:home with mother   10/22/2017 Loni Muse, MD

## 2017-10-22 NOTE — Lactation Note (Signed)
This note was copied from a baby's chart. Lactation Consultation Note  Patient Name: Tonya [name] ZOXWR'UToday's Date: 10/22/2017 Reason for consult: Follow-up assessment;Infant weight loss  Dad translated ( consent has been signed )  Pecola LeisureBaby is 2954 hours old and for D/C today LC reviewed and updated the doc flow sheets  Mom denies sore nipples, sore nipples and engorgement prevention and tx  Reviewed. LC instructed mom on the use of hand pump and checked  Flange , #24 F a good comfortable fit.  Mother informed of post-discharge support and given phone number to the lactation department, including services for phone call assistance; out-patient appointments; and breastfeeding support group. List of other breastfeeding resources in the community given in the handout. Encouraged mother to call for problems or concerns related to breastfeeding.     Maternal Data Has patient been taught Hand Expression?: Yes  Feeding Feeding Type: Breast Fed Length of feed: 20 min(per dad )  LATCH Score                   Interventions Interventions: Breast feeding basics reviewed  Lactation Tools Discussed/Used Tools: Pump Breast pump type: Manual Pump Review: Setup, frequency, and cleaning;Milk Storage Initiated by:: MAI  Date initiated:: 10/22/17   Consult Status Consult Status: Complete Date: 10/22/17 Follow-up type: In-patient    Tonya [name] 10/22/2017, 10:29 AM

## 2017-11-08 ENCOUNTER — Ambulatory Visit (HOSPITAL_COMMUNITY)
Admission: EM | Admit: 2017-11-08 | Discharge: 2017-11-08 | Disposition: A | Discharge status: Home / Self Care | Payer: Medicaid Other | Attending: Family Medicine | Admitting: Family Medicine

## 2017-11-08 ENCOUNTER — Encounter (HOSPITAL_COMMUNITY): Payer: Self-pay

## 2017-11-08 ENCOUNTER — Other Ambulatory Visit: Payer: Self-pay

## 2017-11-08 DIAGNOSIS — R0781 Pleurodynia: Secondary | ICD-10-CM

## 2017-11-08 MED ORDER — PREDNISONE 20 MG PO TABS: ORAL_TABLET | ORAL | 0 refills | Status: DC

## 2017-11-08 NOTE — ED Provider Notes (Signed)
[facility]   [id number] 11/08/17 Arrival Time: 1242   SUBJECTIVE:  [name] is a 32 y.o. female who presents to the urgent care with complaint of pain under her right breast. She thinks it is rib pain that radiates to her back. x3 days. Does not recall any injury to cause pain.  Breast feeding Pain is better today  Pain is worse by twisting.  Patient from [place]  Past Medical History:  Diagnosis Date  . Medical history non-contributory    Family History  Problem Relation Age of Onset  . Alcohol abuse Neg Hx   . Arthritis Neg Hx   . Asthma Neg Hx   . Birth defects Neg Hx   . Cancer Neg Hx   . COPD Neg Hx   . Depression Neg Hx   . Diabetes Neg Hx   . Drug abuse Neg Hx   . Early death Neg Hx   . Hearing loss Neg Hx   . Heart disease Neg Hx   . Hyperlipidemia Neg Hx   . Hypertension Neg Hx   . Kidney disease Neg Hx   . Learning disabilities Neg Hx   . Mental illness Neg Hx   . Mental retardation Neg Hx   . Miscarriages / Stillbirths Neg Hx   . Stroke Neg Hx   . Vision loss Neg Hx   . Varicose Veins Neg Hx    Social History   Socioeconomic History  . Marital status: Married    Spouse name: Not on file  . Number of children: Not on file  . Years of education: Not on file  . Highest education level: Not on file  Social Needs  . Financial resource strain: Not on file  . Food insecurity - worry: Not on file  . Food insecurity - inability: Not on file  . Transportation needs - medical: Not on file  . Transportation needs - non-medical: Not on file  Occupational History  . Not on file  Tobacco Use  . Smoking status: Never Smoker  . Smokeless tobacco: Never Used  Substance and Sexual Activity  . Alcohol use: No  . Drug use: No  . Sexual activity: Yes  Other Topics Concern  . Not on file  Social History Narrative  . Not on file   Current Meds  Medication Sig  . acetaminophen (TYLENOL) 500 MG tablet Take 1,000 mg by mouth every 6 (six)  hours as needed for moderate pain or headache.  . Prenatal Vit-Fe Fumarate-FA (MULTIVITAMIN-PRENATAL) 27-0.8 MG TABS tablet Take 1 tablet by mouth daily at 12 noon.   No Known Allergies    ROS: As per HPI, remainder of ROS negative.   OBJECTIVE:   Vitals:   11/08/17 1317  BP: 122/82  Pulse: 81  Resp: 18  Temp: (!) 97.3 F (36.3 C)  TempSrc: Oral  SpO2: 99%     General appearance: alert; no distress Eyes: PERRL; EOMI; conjunctiva normal HENT: normocephalic; atraumatic;; oral mucosa normal Neck: supple Lungs: clear to auscultation bilaterally Heart: regular rate and rhythm Abdomen: soft, non-tender; bowel sounds normal; no masses or organomegaly; no guarding or rebound tenderness Back: no CVA tenderness Extremities: no cyanosis or edema; symmetrical with no gross deformities Skin: warm and dry Neurologic: normal gait; grossly normal Psychological: alert and cooperative; normal mood and affect      Labs:  Results for orders placed or performed during the hospital encounter of 10/20/17  CBC  Result Value Ref Range   WBC  9.8 4.0 - 10.5 K/uL   RBC 4.54 3.87 - 5.11 MIL/uL   Hemoglobin 10.4 (L) 12.0 - 15.0 g/dL   HCT 16.1 (L) 09.6 - 04.5 %   MCV 70.9 (L) 78.0 - 100.0 fL   MCH 22.9 (L) 26.0 - 34.0 pg   MCHC 32.3 30.0 - 36.0 g/dL   RDW 40.9 (H) 81.1 - 91.4 %   Platelets 192 150 - 400 K/uL  RPR  Result Value Ref Range   RPR Ser Ql Non Reactive Non Reactive  Comprehensive metabolic panel  Result Value Ref Range   Sodium 133 (L) 135 - 145 mmol/L   Potassium 4.1 3.5 - 5.1 mmol/L   Chloride 106 101 - 111 mmol/L   CO2 19 (L) 22 - 32 mmol/L   Glucose, Bld 83 65 - 99 mg/dL   BUN 11 6 - 20 mg/dL   Creatinine, Ser 7.82 0.44 - 1.00 mg/dL   Calcium 8.6 (L) 8.9 - 10.3 mg/dL   Total Protein 7.1 6.5 - 8.1 g/dL   Albumin 3.1 (L) 3.5 - 5.0 g/dL   AST 22 15 - 41 U/L   ALT 14 14 - 54 U/L   Alkaline Phosphatase 190 (H) 38 - 126 U/L   Total Bilirubin 0.7 0.3 - 1.2 mg/dL    GFR calc non Af Amer >60 >60 mL/min   GFR calc Af Amer >60 >60 mL/min   Anion gap 8 5 - 15  Type and screen [facility]  Result Value Ref Range   ABO/RH(D) B POS    Antibody Screen NEG    Sample Expiration      10/23/2017 Performed at [facility], [address], [place], [place] [address]     Labs Reviewed - No data to display  No results found.     ASSESSMENT & PLAN:  1. Rib pain on right side     Meds ordered this encounter  Medications  . predniSONE (DELTASONE) 20 MG tablet    Sig: One daily with food    Dispense:  5 tablet    Refill:  0    Reviewed expectations re: course of current medical issues. Questions answered. Outlined signs and symptoms indicating need for more acute intervention. Patient verbalized understanding. After Visit Summary given.    Procedures:      Elvina Sidle, MD 11/08/17 1406

## 2017-11-08 NOTE — ED Triage Notes (Signed)
Pt presents today with pain under her right breast. She thinks it is rib pain that radiates to her back. x3 days. Does not recall any injury to cause pain.

## 2019-04-06 ENCOUNTER — Encounter (HOSPITAL_COMMUNITY): Payer: Self-pay | Admitting: *Deleted

## 2019-04-06 ENCOUNTER — Other Ambulatory Visit: Payer: Self-pay

## 2019-04-06 ENCOUNTER — Inpatient Hospital Stay (HOSPITAL_COMMUNITY)
Admission: EM | Admit: 2019-04-06 | Discharge: 2019-04-07 | Disposition: A | Discharge status: Home / Self Care | Payer: Medicaid Other | Attending: Family Medicine | Admitting: Family Medicine

## 2019-04-06 DIAGNOSIS — R102 Pelvic and perineal pain: Secondary | ICD-10-CM | POA: Insufficient documentation

## 2019-04-06 DIAGNOSIS — O26899 Other specified pregnancy related conditions, unspecified trimester: Secondary | ICD-10-CM

## 2019-04-06 DIAGNOSIS — M79606 Pain in leg, unspecified: Secondary | ICD-10-CM | POA: Insufficient documentation

## 2019-04-06 DIAGNOSIS — M545 Low back pain, unspecified: Secondary | ICD-10-CM

## 2019-04-06 DIAGNOSIS — M79604 Pain in right leg: Secondary | ICD-10-CM | POA: Diagnosis not present

## 2019-04-06 DIAGNOSIS — O26891 Other specified pregnancy related conditions, first trimester: Secondary | ICD-10-CM | POA: Diagnosis not present

## 2019-04-06 DIAGNOSIS — O3680X0 Pregnancy with inconclusive fetal viability, not applicable or unspecified: Secondary | ICD-10-CM

## 2019-04-06 DIAGNOSIS — Z3A01 Less than 8 weeks gestation of pregnancy: Secondary | ICD-10-CM | POA: Insufficient documentation

## 2019-04-06 LAB — POC URINE PREG, ED: Preg Test, Ur: POSITIVE — AB

## 2019-04-06 NOTE — ED Triage Notes (Signed)
Pt has been having lower back pain radiating into her legs; denies injury. LMP the beginning of July

## 2019-04-07 ENCOUNTER — Encounter (HOSPITAL_COMMUNITY): Payer: Self-pay

## 2019-04-07 ENCOUNTER — Inpatient Hospital Stay (HOSPITAL_COMMUNITY): Payer: Medicaid Other

## 2019-04-07 ENCOUNTER — Other Ambulatory Visit: Payer: Self-pay

## 2019-04-07 DIAGNOSIS — O3680X0 Pregnancy with inconclusive fetal viability, not applicable or unspecified: Secondary | ICD-10-CM

## 2019-04-07 DIAGNOSIS — O26899 Other specified pregnancy related conditions, unspecified trimester: Secondary | ICD-10-CM | POA: Diagnosis present

## 2019-04-07 DIAGNOSIS — M79605 Pain in left leg: Secondary | ICD-10-CM

## 2019-04-07 DIAGNOSIS — O26891 Other specified pregnancy related conditions, first trimester: Secondary | ICD-10-CM

## 2019-04-07 DIAGNOSIS — Z3A01 Less than 8 weeks gestation of pregnancy: Secondary | ICD-10-CM

## 2019-04-07 DIAGNOSIS — N90810 Female genital mutilation status, unspecified: Secondary | ICD-10-CM | POA: Insufficient documentation

## 2019-04-07 DIAGNOSIS — R102 Pelvic and perineal pain: Secondary | ICD-10-CM

## 2019-04-07 DIAGNOSIS — M545 Low back pain: Secondary | ICD-10-CM

## 2019-04-07 DIAGNOSIS — M79604 Pain in right leg: Secondary | ICD-10-CM

## 2019-04-07 LAB — URINALYSIS, ROUTINE W REFLEX MICROSCOPIC
Bilirubin Urine: NEGATIVE
Glucose, UA: NEGATIVE mg/dL
Hgb urine dipstick: NEGATIVE
Ketones, ur: NEGATIVE mg/dL
Leukocytes,Ua: NEGATIVE
Nitrite: NEGATIVE
Protein, ur: NEGATIVE mg/dL
Specific Gravity, Urine: 1.019 (ref 1.005–1.030)
pH: 5 (ref 5.0–8.0)

## 2019-04-07 LAB — CBC
HCT: 36.7 % (ref 36.0–46.0)
Hemoglobin: 12 g/dL (ref 12.0–15.0)
MCH: 27.5 pg (ref 26.0–34.0)
MCHC: 32.7 g/dL (ref 30.0–36.0)
MCV: 84 fL (ref 80.0–100.0)
Platelets: 222 10*3/uL (ref 150–400)
RBC: 4.37 MIL/uL (ref 3.87–5.11)
RDW: 13.4 % (ref 11.5–15.5)
WBC: 6.8 10*3/uL (ref 4.0–10.5)
nRBC: 0 % (ref 0.0–0.2)

## 2019-04-07 LAB — WET PREP, GENITAL
Clue Cells Wet Prep HPF POC: NONE SEEN
Sperm: NONE SEEN
Trich, Wet Prep: NONE SEEN
Yeast Wet Prep HPF POC: NONE SEEN

## 2019-04-07 LAB — HIV ANTIBODY (ROUTINE TESTING W REFLEX): HIV Screen 4th Generation wRfx: NONREACTIVE

## 2019-04-07 LAB — HCG, QUANTITATIVE, PREGNANCY: hCG, Beta Chain, Quant, S: 58595 m[IU]/mL — ABNORMAL HIGH (ref <5)

## 2019-04-07 MED ORDER — CALCIUM CARBONATE ANTACID 500 MG PO CHEW
400.0000 mg | CHEWABLE_TABLET | Freq: Once | ORAL | Status: AC
  Administered 2019-04-07: 400 mg via ORAL
  Filled 2019-04-07: qty 2

## 2019-04-07 MED ORDER — TRAMADOL HCL 50 MG PO TABS: 50.0000 mg | ORAL_TABLET | Freq: Four times a day (QID) | ORAL | 0 refills | Status: DC | PRN

## 2019-04-07 MED ORDER — TRAMADOL HCL 50 MG PO TABS: 50.0000 mg | ORAL_TABLET | Freq: Once | ORAL | Status: DC

## 2019-04-07 NOTE — MAU Provider Note (Signed)
Chief Complaint: Back Pain and Leg Pain   First Provider Initiated Contact with Patient 04/07/19 0039        SUBJECTIVE HPI: Tonya [name] is a 33 y.o. [id number] at [redacted]w[redacted]d by LMP who presents to maternity admissions reporting pain in upper legs which is worsened by walking and relieved with rest. Tylenol did not help.  Has also had lower abdominal pain and low back pain for over a week, not getting better.  . She denies vaginal bleeding, vaginal itching/burning, urinary symptoms, h/a, dizziness, n/v, or fever/chills.    Interpretor used.  Had a vaginal Delivery on 10/20/2017 which was uncomplicated except for late gestational hypertension.    RN note: Pt here for upper leg pain that started a few nights ago. States she tylenol but did not help. Walking makes the pain worse, sitting helps. Pt denies vaginal bleeding, reports some vaginal discharge. Denies odor, itching or irritation. LMP: around 02/26/19  Past Medical History:  Diagnosis Date  . Medical history non-contributory    Past Surgical History:  Procedure Laterality Date  . NO PAST SURGERIES     Social History   Socioeconomic History  . Marital status: Married    Spouse name: Not on file  . Number of children: Not on file  . Years of education: Not on file  . Highest education level: Not on file  Occupational History  . Not on file  Social Needs  . Financial resource strain: Not on file  . Food insecurity    Worry: Not on file    Inability: Not on file  . Transportation needs    Medical: Not on file    Non-medical: Not on file  Tobacco Use  . Smoking status: Never Smoker  . Smokeless tobacco: Never Used  Substance and Sexual Activity  . Alcohol use: No  . Drug use: No  . Sexual activity: Yes  Lifestyle  . Physical activity    Days per week: Not on file    Minutes per session: Not on file  . Stress: Not on file  Relationships  . Social Herbalist on phone: Not on file    Gets together: Not on file     Attends religious service: Not on file    Active member of club or organization: Not on file    Attends meetings of clubs or organizations: Not on file    Relationship status: Not on file  . Intimate partner violence    Fear of current or ex partner: Not on file    Emotionally abused: Not on file    Physically abused: Not on file    Forced sexual activity: Not on file  Other Topics Concern  . Not on file  Social History Narrative  . Not on file   No current facility-administered medications on file prior to encounter.    Current Outpatient Medications on File Prior to Encounter  Medication Sig Dispense Refill  . acetaminophen (TYLENOL) 500 MG tablet Take 1,000 mg by mouth every 6 (six) hours as needed for moderate pain or headache.    . predniSONE (DELTASONE) 20 MG tablet One daily with food 5 tablet 0  . Prenatal Vit-Fe Fumarate-FA (MULTIVITAMIN-PRENATAL) 27-0.8 MG TABS tablet Take 1 tablet by mouth daily at 12 noon.     No Known Allergies  I have reviewed patient's Past Medical Hx, Surgical Hx, Family Hx, Social Hx, medications and allergies.   ROS:  Review of Systems  Constitutional: Negative for chills  and fever.  Gastrointestinal: Positive for abdominal pain. Negative for constipation, diarrhea and nausea.  Genitourinary: Positive for pelvic pain. Negative for vaginal bleeding.  Musculoskeletal: Positive for back pain.       Top of both legs have pain, cannot describe.  Worse when walking, better when resting   Review of Systems  Other systems negative   Physical Exam  Physical Exam Patient Vitals for the past 24 hrs:  BP Temp Temp src Pulse Resp SpO2  04/06/19 2236 140/77 (!) 97.5 F (36.4 C) Oral 80 18 100 %   Constitutional: Well-developed, well-nourished female in no acute distress.  Cardiovascular: normal rate Respiratory: normal effort GI: Abd soft, non-tender. Pos BS x 4 MS: Extremities nontender, no edema, normal ROM Neurologic: Alert and oriented x 4.   GU: Neg CVAT.  PELVIC EXAM: Cervix pink, visually closed, without lesion, scant white creamy discharge, vaginal walls and external genitalia without lesion or erethema.  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  Bimanual exam: Cervix 0/long/high, firm, anterior, neg CMT, uterus slightly tender, nonenlarged, adnexa without tenderness, enlargement, or mass    LAB RESULTS Results for orders placed or performed during the hospital encounter of 04/06/19 (from the past 24 hour(s))  Urinalysis, Routine w reflex microscopic- may I&O cath if menses     Status: None   Collection Time: 04/06/19 10:59 PM  Result Value Ref Range   Color, Urine YELLOW YELLOW   APPearance CLEAR CLEAR   Specific Gravity, Urine 1.019 1.005 - 1.030   pH 5.0 5.0 - 8.0   Glucose, UA NEGATIVE NEGATIVE mg/dL   Hgb urine dipstick NEGATIVE NEGATIVE   Bilirubin Urine NEGATIVE NEGATIVE   Ketones, ur NEGATIVE NEGATIVE mg/dL   Protein, ur NEGATIVE NEGATIVE mg/dL   Nitrite NEGATIVE NEGATIVE   Leukocytes,Ua NEGATIVE NEGATIVE  POC Urine Pregnancy, ED (not at [facility])     Status: Abnormal   Collection Time: 04/06/19 11:09 PM  Result Value Ref Range   Preg Test, Ur POSITIVE (A) NEGATIVE  Wet prep, genital     Status: Abnormal   Collection Time: 04/07/19 12:53 AM  Result Value Ref Range   Yeast Wet Prep HPF POC NONE SEEN NONE SEEN   Trich, Wet Prep NONE SEEN NONE SEEN   Clue Cells Wet Prep HPF POC NONE SEEN NONE SEEN   WBC, Wet Prep HPF POC MODERATE (A) NONE SEEN   Sperm NONE SEEN   CBC     Status: None   Collection Time: 04/07/19  1:17 AM  Result Value Ref Range   WBC 6.8 4.0 - 10.5 K/uL   RBC 4.37 3.87 - 5.11 MIL/uL   Hemoglobin 12.0 12.0 - 15.0 g/dL   HCT 60.436.7 54.036.0 - 98.146.0 %   MCV 84.0 80.0 - 100.0 fL   MCH 27.5 26.0 - 34.0 pg   MCHC 32.7 30.0 - 36.0 g/dL   RDW 19.113.4 47.811.5 - 29.515.5 %   Platelets 222 150 - 400 K/uL   nRBC 0.0 0.0 - 0.2 %  hCG, quantitative, pregnancy     Status: Abnormal   Collection Time: 04/07/19  1:17  AM  Result Value Ref Range   hCG, Beta Chain, Quant, S 58,595 (H) <5 mIU/mL    IMAGING [place] Ob Comp Less 14 Wks  Result Date: 04/07/2019 CLINICAL DATA:  33 year old pregnant female with vaginal discharge. LMP: 02/26/2019 corresponding to an estimated gestational age of [redacted] weeks, 5 days. EXAM: OBSTETRIC <14 WK US AND TRANSVAGINAL OB US TECHNIQUE: Both transabdominal and transvaginal  ultrasound examinations were performed for complete evaluation of the gestation as well as the maternal uterus, adnexal regions, and pelvic cul-de-sac. Transvaginal technique was performed to assess early pregnancy. COMPARISON:  None. FINDINGS: Intrauterine gestational sac: Single intrauterine gestational sac. Yolk sac:  Seen Embryo:  Present Cardiac Activity: Detected Heart Rate: 147 bpm CRL:  8 mm   6 w   5 d                  US EDC: 11/26/2019 Subchorionic hemorrhage:  None visualized. Maternal uterus/adnexae: The maternal ovaries are unremarkable. There is a corpus luteum in the right ovary. IMPRESSION: Single live intrauterine pregnancy with an estimated gestational age of [redacted] weeks, 5 days. Electronically Signed   By: Elgie CollardArash  Radparvar M.D.   On: 04/07/2019 01:50   [place] Ob Transvaginal  Result Date: 04/07/2019 CLINICAL DATA:  33 year old pregnant female with vaginal discharge. LMP: 02/26/2019 corresponding to an estimated gestational age of [redacted] weeks, 5 days. EXAM: OBSTETRIC <14 WK US AND TRANSVAGINAL OB US TECHNIQUE: Both transabdominal and transvaginal ultrasound examinations were performed for complete evaluation of the gestation as well as the maternal uterus, adnexal regions, and pelvic cul-de-sac. Transvaginal technique was performed to assess early pregnancy. COMPARISON:  None. FINDINGS: Intrauterine gestational sac: Single intrauterine gestational sac. Yolk sac:  Seen Embryo:  Present Cardiac Activity: Detected Heart Rate: 147 bpm CRL:  8 mm   6 w   5 d                  US EDC: 11/26/2019 Subchorionic hemorrhage:  None  visualized. Maternal uterus/adnexae: The maternal ovaries are unremarkable. There is a corpus luteum in the right ovary. IMPRESSION: Single live intrauterine pregnancy with an estimated gestational age of [redacted] weeks, 5 days. Electronically Signed   By: Elgie CollardArash  Radparvar M.D.   On: 04/07/2019 01:50     MAU Management/MDM: Ordered usual first trimester r/o ectopic labs.   Pelvic exam and cultures done  Wet prep normal  Will check baseline Ultrasound to rule out ectopic. >> Single live IUP seen.  This bleeding/pain can represent a normal pregnancy with bleeding, spontaneous abortion or even an ectopic which can be life-threatening.  The process as listed above helps to determine which of these is present. Offered to try a single dose of Tramadol for leg pain, since Tylenol did not help.  Patient wants to just get a prescription and go home.  Discussed limiting use   Rx for #10 tab given with no refills.   ASSESSMENT Pelvic pain in early pregnancy Low back pain in pregnancy Upper leg pain in pregnancy Pregnancy of unknown anatomic location, confirmed single live intrauterine pregnancy at [id number]  PLAN Discharge home Rx Tramadol for prn use at home for pain, #10, no refills Encouraged to start prenatal care, number given for clinic Pt stable at time of discharge. Encouraged to return here or to other Urgent Care/ED if she develops worsening of symptoms, increase in pain, fever, or other concerning symptoms.   Wynelle BourgeoisMarie Williams CNM, MSN Certified Nurse-Midwife 04/07/2019  12:39 AM

## 2019-04-07 NOTE — MAU Note (Signed)
Pt here for upper leg pain that started a few nights ago. States she tylenol but did not help. Walking makes the pain worse, sitting helps. Pt denies vaginal bleeding, reports some vaginal discharge. Denies odor, itching or irritation. LMP: around 02/26/19

## 2019-04-07 NOTE — Discharge Instructions (Signed)
First Trimester of Pregnancy ° °The first trimester of pregnancy is from week 1 until the end of week 13 (months 1 through 3). During this time, your baby will begin to develop inside you. At 6-8 weeks, the eyes and face are formed, and the heartbeat can be seen on ultrasound. At the end of 12 weeks, all the baby's organs are formed. Prenatal care is all the medical care you receive before the birth of your baby. Make sure you get good prenatal care and follow all of your doctor's instructions. °Follow these instructions at home: °Medicines °· Take over-the-counter and prescription medicines only as told by your doctor. Some medicines are safe and some medicines are not safe during pregnancy. °· Take a prenatal vitamin that contains at least 600 micrograms (mcg) of folic acid. °· If you have trouble pooping (constipation), take medicine that will make your stool soft (stool softener) if your doctor approves. °Eating and drinking ° °· Eat regular, healthy meals. °· Your doctor will tell you the amount of weight gain that is right for you. °· Avoid raw meat and uncooked cheese. °· If you feel sick to your stomach (nauseous) or throw up (vomit): °? Eat 4 or 5 small meals a day instead of 3 large meals. °? Try eating a few soda crackers. °? Drink liquids between meals instead of during meals. °· To prevent constipation: °? Eat foods that are high in fiber, like fresh fruits and vegetables, whole grains, and beans. °? Drink enough fluids to keep your pee (urine) clear or pale yellow. °Activity °· Exercise only as told by your doctor. Stop exercising if you have cramps or pain in your lower belly (abdomen) or low back. °· Do not exercise if it is too hot, too humid, or if you are in a place of great height (high altitude). °· Try to avoid standing for long periods of time. Move your legs often if you must stand in one place for a long time. °· Avoid heavy lifting. °· Wear low-heeled shoes. Sit and stand up  straight. °· You can have sex unless your doctor tells you not to. °Relieving pain and discomfort °· Wear a good support bra if your breasts are sore. °· Take warm water baths (sitz baths) to soothe pain or discomfort caused by hemorrhoids. Use hemorrhoid cream if your doctor says it is okay. °· Rest with your legs raised if you have leg cramps or low back pain. °· If you have puffy, bulging veins (varicose veins) in your legs: °? Wear support hose or compression stockings as told by your doctor. °? Raise (elevate) your feet for 15 minutes, 3-4 times a day. °? Limit salt in your food. °Prenatal care °· Schedule your prenatal visits by the twelfth week of pregnancy. °· Write down your questions. Take them to your prenatal visits. °· Keep all your prenatal visits as told by your doctor. This is important. °Safety °· Wear your seat belt at all times when driving. °· Make a list of emergency phone numbers. The list should include numbers for family, friends, the hospital, and police and fire departments. °General instructions °· Ask your doctor for a referral to a local prenatal class. Begin classes no later than at the start of month 6 of your pregnancy. °· Ask for help if you need counseling or if you need help with nutrition. Your doctor can give you advice or tell you where to go for help. °· Do not use hot tubs, steam   rooms, or saunas. °· Do not douche or use tampons or scented sanitary pads. °· Do not cross your legs for long periods of time. °· Avoid all herbs and alcohol. Avoid drugs that are not approved by your doctor. °· Do not use any tobacco products, including cigarettes, chewing tobacco, and electronic cigarettes. If you need help quitting, ask your doctor. You may get counseling or other support to help you quit. °· Avoid cat litter boxes and soil used by cats. These carry germs that can cause birth defects in the baby and can cause a loss of your baby (miscarriage) or stillbirth. °· Visit your dentist.  At home, brush your teeth with a soft toothbrush. Be gentle when you floss. °Contact a doctor if: °· You are dizzy. °· You have mild cramps or pressure in your lower belly. °· You have a nagging pain in your belly area. °· You continue to feel sick to your stomach, you throw up, or you have watery poop (diarrhea). °· You have a bad smelling fluid coming from your vagina. °· You have pain when you pee (urinate). °· You have increased puffiness (swelling) in your face, hands, legs, or ankles. °Get help right away if: °· You have a fever. °· You are leaking fluid from your vagina. °· You have spotting or bleeding from your vagina. °· You have very bad belly cramping or pain. °· You gain or lose weight rapidly. °· You throw up blood. It may look like coffee grounds. °· You are around people who have German measles, fifth disease, or chickenpox. °· You have a very bad headache. °· You have shortness of breath. °· You have any kind of trauma, such as from a fall or a car accident. °Summary °· The first trimester of pregnancy is from week 1 until the end of week 13 (months 1 through 3). °· To take care of yourself and your unborn baby, you will need to eat healthy meals, take medicines only if your doctor tells you to do so, and do activities that are safe for you and your baby. °· Keep all follow-up visits as told by your doctor. This is important as your doctor will have to ensure that your baby is healthy and growing well. °This information is not intended to replace advice given to you by your health care provider. Make sure you discuss any questions you have with your health care provider. °Document Released: 01/29/2008 Document Revised: 12/03/2018 Document Reviewed: 08/20/2016 °Elsevier Patient Education © 2020 [employer] ° °

## 2019-04-07 NOTE — ED Notes (Signed)
Spoke with MAU and they will see the pt. Pt refused wheelchair for transport.

## 2019-04-08 LAB — GC/CHLAMYDIA PROBE AMP (~~LOC~~) NOT AT ARMC
Chlamydia: NEGATIVE
Neisseria Gonorrhea: NEGATIVE

## 2019-04-16 ENCOUNTER — Encounter: Payer: Self-pay | Admitting: General Practice

## 2019-07-05 DIAGNOSIS — Z3480 Encounter for supervision of other normal pregnancy, unspecified trimester: Secondary | ICD-10-CM | POA: Insufficient documentation

## 2019-07-06 ENCOUNTER — Other Ambulatory Visit (HOSPITAL_COMMUNITY)
Admission: RE | Admit: 2019-07-06 | Discharge: 2019-07-06 | Disposition: A | Discharge status: Home / Self Care | Payer: Medicaid Other | Source: Ambulatory Visit | Attending: Advanced Practice Midwife | Admitting: Advanced Practice Midwife

## 2019-07-06 ENCOUNTER — Ambulatory Visit (INDEPENDENT_AMBULATORY_CARE_PROVIDER_SITE_OTHER): Payer: Medicaid Other | Admitting: Advanced Practice Midwife

## 2019-07-06 ENCOUNTER — Encounter: Payer: Self-pay | Admitting: Advanced Practice Midwife

## 2019-07-06 ENCOUNTER — Other Ambulatory Visit: Payer: Self-pay

## 2019-07-06 VITALS — BP 118/73 | HR 78 | Temp 97.6°F | Wt 209.4 lb

## 2019-07-06 DIAGNOSIS — Z3480 Encounter for supervision of other normal pregnancy, unspecified trimester: Secondary | ICD-10-CM | POA: Diagnosis present

## 2019-07-06 DIAGNOSIS — Z3A19 19 weeks gestation of pregnancy: Secondary | ICD-10-CM

## 2019-07-06 DIAGNOSIS — O09522 Supervision of elderly multigravida, second trimester: Secondary | ICD-10-CM

## 2019-07-06 MED ORDER — BLOOD PRESSURE KIT DEVI: 1.0000 | 0 refills | Status: DC

## 2019-07-06 NOTE — Progress Notes (Signed)
NEW OB. Needs Interpreter.

## 2019-07-06 NOTE — Progress Notes (Signed)
Subjective:   Tonya [name] is a 33 y.o. G1W2993 at [redacted]w[redacted]d by early ultrasound being seen today for her first obstetrical visit.  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 Patient does intend to breast feed. Pregnancy history fully reviewed.  Patient reports no complaints.  HISTORY: OB History  Gravida Para Term Preterm AB Living  6 5 5  0 0 5  SAB TAB Ectopic Multiple Live Births  0 0 0 0 5    # Outcome Date GA Lbr Len/2nd Weight Sex Delivery Anes PTL Lv  6 Current           5 Term 10/20/17 [redacted]w[redacted]d 00:40 / 00:02 7 lb 8.5 oz (3.415 kg) F Vag-Spont None  LIV     Name: DEYSHA, CARTIER     Apgar1: 9  Apgar5: 9  4 Term 12/31/15 [redacted]w[redacted]d / 00:06 6 lb 3.1 oz (2.81 kg) F Vag-Spont None  LIV     Name: Starr Sinclair     Apgar1: 9  Apgar5: 9  3 Term     M Vag-Spont None N LIV  2 Term     M Vag-Spont None N LIV  1 Term     M Vag-Spont None N LIV   Past Medical History:  Diagnosis Date  . Medical history non-contributory    Past Surgical History:  Procedure Laterality Date  . NO PAST SURGERIES     Family History  Problem Relation Age of Onset  . Cancer Mother   . Alcohol abuse Neg Hx   . Arthritis Neg Hx   . Asthma Neg Hx   . Birth defects Neg Hx   . COPD Neg Hx   . Depression Neg Hx   . Diabetes Neg Hx   . Drug abuse Neg Hx   . Early death Neg Hx   . Hearing loss Neg Hx   . Heart disease Neg Hx   . Hyperlipidemia Neg Hx   . Hypertension Neg Hx   . Kidney disease Neg Hx   . Learning disabilities Neg Hx   . Mental illness Neg Hx   . Mental retardation Neg Hx   . Miscarriages / Stillbirths Neg Hx   . Stroke Neg Hx   . Vision loss Neg Hx   . Varicose Veins Neg Hx    Social History   Tobacco Use  . Smoking status: Never Smoker  . Smokeless tobacco: Never  Used  Substance Use Topics  . Alcohol use: No  . Drug use: No   No Known Allergies Current Outpatient Medications on File Prior to Visit  Medication Sig Dispense Refill  . acetaminophen (TYLENOL) 500 MG tablet Take 1,000 mg by mouth every 6 (six) hours as needed for moderate pain or headache.    . Prenatal Vit-Fe Fumarate-FA (MULTIVITAMIN-PRENATAL) 27-0.8 MG TABS tablet Take 1 tablet by mouth daily at 12 noon.    . traMADol (ULTRAM) 50 MG tablet Take 1 tablet (50 mg total) by mouth every 6 (six) hours as needed for moderate pain. (Patient not taking: Reported on 07/06/2019) 10 tablet 0   No current facility-administered medications on file prior to visit.     Exam   Vitals:   07/06/19 1611  BP: 118/73  Pulse: 78  Temp: 97.6 F (36.4 C)  Weight: 209 lb 6.4 oz (95 kg)  Fetal Heart Rate (bpm): 142  Uterus:     Pelvic Exam: Perineum: no hemorrhoids, normal perineum   Vulva: normal external genitalia, no lesions   Vagina:  normal mucosa, normal discharge   Cervix: no lesions and normal, pap smear done.    Adnexa: normal adnexa and no mass, fullness, tenderness   Bony Pelvis: average  System: General: well-developed, well-nourished female in no acute distress   Breast:  normal appearance, no masses or tenderness   Skin: normal coloration and turgor, no rashes   Neurologic: oriented, normal, negative, normal mood   Extremities: normal strength, tone, and muscle mass, ROM of all joints is normal   HEENT PERRLA, extraocular movement intact and sclera clear, anicteric   Mouth/Teeth mucous membranes moist, pharynx normal without lesions and dental hygiene good   Neck supple and no masses   Cardiovascular: regular rate and rhythm   Respiratory:  no respiratory distress, normal breath sounds   Abdomen: soft, non-tender; bowel sounds normal; no masses,  no organomegaly     Assessment:   Pregnancy: S2G3151 Patient Active Problem List   Diagnosis Date Noted  . Supervision of  other normal pregnancy, antepartum 07/05/2019  . 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 Pelvic pain affecting pregnancy 04/07/2019  . Language barrier 10/16/2015     Plan:  1. Supervision of other normal pregnancy, antepartum --Anticipatory guidance about next visits/weeks of pregnancy given. --Somali interpreter with video language line used for all communication - Babyscripts Schedule Optimization - Enroll Patient in Babyscripts - AFP, Serum, Open Spina Bifida - Obstetric Panel, Including HIV - Culture, OB Urine - Genetic Screening - Cytology - PAP( [place]) - Cervicovaginal ancillary only( [place]) - [place] MFM OB COMP + 14 WK; Future  2. Advanced maternal age in multigravida, second trimester --No other complications or health hx, previous child born last year.   --NIPS testing done today   Initial labs drawn. Continue prenatal vitamins. Discussed and offered genetic screening options, including Quad screen/AFP, NIPS testing, and option to decline testing. Benefits/risks/alternatives reviewed. Pt aware that anatomy US is form of genetic screening with lower accuracy in detecting trisomies than blood work.  Pt chooses genetic screening today. NIPS: ordered. Ultrasound discussed; fetal anatomic survey: ordered. Problem list reviewed and updated. The nature of [place] [facility], [facility] with multiple MDs and other Advanced Practice Providers was explained to patient; also emphasized that residents, students are part of our team. Routine obstetric precautions reviewed. Return in about 4 weeks (around 08/03/2019).   Sharen Counter, CNM 07/06/19 4:45 PM

## 2019-07-06 NOTE — Patient Instructions (Signed)

## 2019-07-07 LAB — CERVICOVAGINAL ANCILLARY ONLY
Bacterial Vaginitis (gardnerella): POSITIVE — AB
Candida Glabrata: NEGATIVE
Candida Vaginitis: NEGATIVE
Chlamydia: NEGATIVE
Comment: NEGATIVE
Comment: NEGATIVE
Comment: NEGATIVE
Comment: NEGATIVE
Comment: NEGATIVE
Comment: NORMAL
Neisseria Gonorrhea: NEGATIVE
Trichomonas: NEGATIVE

## 2019-07-08 LAB — CYTOLOGY - PAP
Comment: NEGATIVE
Diagnosis: NEGATIVE
High risk HPV: NEGATIVE

## 2019-07-08 LAB — CULTURE, OB URINE

## 2019-07-08 LAB — URINE CULTURE, OB REFLEX

## 2019-07-09 LAB — OBSTETRIC PANEL, INCLUDING HIV
Antibody Screen: NEGATIVE
Basophils Absolute: 0 10*3/uL (ref 0.0–0.2)
Basos: 0 %
EOS (ABSOLUTE): 0.3 10*3/uL (ref 0.0–0.4)
Eos: 4 %
HIV Screen 4th Generation wRfx: NONREACTIVE
Hematocrit: 35.3 % (ref 34.0–46.6)
Hemoglobin: 12 g/dL (ref 11.1–15.9)
Hepatitis B Surface Ag: NEGATIVE
Immature Grans (Abs): 0 10*3/uL (ref 0.0–0.1)
Immature Granulocytes: 0 %
Lymphocytes Absolute: 1.7 10*3/uL (ref 0.7–3.1)
Lymphs: 24 %
MCH: 28.1 pg (ref 26.6–33.0)
MCHC: 34 g/dL (ref 31.5–35.7)
MCV: 83 fL (ref 79–97)
Monocytes Absolute: 0.4 10*3/uL (ref 0.1–0.9)
Monocytes: 6 %
Neutrophils Absolute: 4.5 10*3/uL (ref 1.4–7.0)
Neutrophils: 66 %
Platelets: 184 10*3/uL (ref 150–450)
RBC: 4.27 x10E6/uL (ref 3.77–5.28)
RDW: 13.6 % (ref 11.7–15.4)
RPR Ser Ql: NONREACTIVE
Rh Factor: POSITIVE
Rubella Antibodies, IGG: 1.5 index (ref >0.99)
WBC: 7 10*3/uL (ref 3.4–10.8)

## 2019-07-09 LAB — AFP, SERUM, OPEN SPINA BIFIDA
AFP MoM: 1.13
AFP Value: 50.5 ng/mL
Gest. Age on Collection Date: 19.7 weeks
Maternal Age At EDD: 35.6 yr
OSBR Risk 1 IN: 8106
Test Results:: NEGATIVE
Weight: 209 [lb_av]

## 2019-07-13 ENCOUNTER — Other Ambulatory Visit (HOSPITAL_COMMUNITY): Payer: Self-pay | Admitting: *Deleted

## 2019-07-13 ENCOUNTER — Ambulatory Visit (HOSPITAL_COMMUNITY)
Admission: RE | Admit: 2019-07-13 | Discharge: 2019-07-13 | Disposition: A | Discharge status: Home / Self Care | Payer: Medicaid Other | Source: Ambulatory Visit | Attending: Advanced Practice Midwife | Admitting: Advanced Practice Midwife

## 2019-07-13 ENCOUNTER — Other Ambulatory Visit: Payer: Self-pay

## 2019-07-13 ENCOUNTER — Other Ambulatory Visit: Payer: Self-pay | Admitting: Advanced Practice Midwife

## 2019-07-13 DIAGNOSIS — O99212 Obesity complicating pregnancy, second trimester: Secondary | ICD-10-CM | POA: Diagnosis not present

## 2019-07-13 DIAGNOSIS — O09522 Supervision of elderly multigravida, second trimester: Secondary | ICD-10-CM | POA: Diagnosis not present

## 2019-07-13 DIAGNOSIS — Z3480 Encounter for supervision of other normal pregnancy, unspecified trimester: Secondary | ICD-10-CM | POA: Insufficient documentation

## 2019-07-13 DIAGNOSIS — O43109 Malformation of placenta, unspecified, unspecified trimester: Secondary | ICD-10-CM

## 2019-07-13 DIAGNOSIS — O0942 Supervision of pregnancy with grand multiparity, second trimester: Secondary | ICD-10-CM | POA: Diagnosis not present

## 2019-07-13 DIAGNOSIS — Z3A20 20 weeks gestation of pregnancy: Secondary | ICD-10-CM

## 2019-07-14 ENCOUNTER — Encounter: Payer: Self-pay | Admitting: Advanced Practice Midwife

## 2019-07-26 ENCOUNTER — Encounter: Payer: Self-pay | Admitting: Advanced Practice Midwife

## 2019-08-03 ENCOUNTER — Ambulatory Visit (INDEPENDENT_AMBULATORY_CARE_PROVIDER_SITE_OTHER): Payer: Medicaid Other | Admitting: Advanced Practice Midwife

## 2019-08-03 ENCOUNTER — Other Ambulatory Visit: Payer: Self-pay

## 2019-08-03 DIAGNOSIS — Z3A23 23 weeks gestation of pregnancy: Secondary | ICD-10-CM

## 2019-08-03 DIAGNOSIS — O26892 Other specified pregnancy related conditions, second trimester: Secondary | ICD-10-CM | POA: Diagnosis not present

## 2019-08-03 DIAGNOSIS — O283 Abnormal ultrasonic finding on antenatal screening of mother: Secondary | ICD-10-CM | POA: Diagnosis not present

## 2019-08-03 DIAGNOSIS — R12 Heartburn: Secondary | ICD-10-CM

## 2019-08-03 DIAGNOSIS — Z3480 Encounter for supervision of other normal pregnancy, unspecified trimester: Secondary | ICD-10-CM

## 2019-08-03 MED ORDER — FAMOTIDINE 20 MG PO TABS: 20.0000 mg | ORAL_TABLET | Freq: Two times a day (BID) | ORAL | 2 refills | Status: DC | PRN

## 2019-08-03 NOTE — Progress Notes (Signed)
Pt presents for a telehealth visit. Pt identified with 2 patient indertifiers. Pt's husband is present for visit. Patient does complain of having heartburn. Patient has no other concerns.

## 2019-08-03 NOTE — Progress Notes (Signed)
   TELEHEALTH VIRTUAL OBSTETRICS VISIT ENCOUNTER NOTE  I connected with Tonya [name] on 08/03/19 at  2:15 PM EST by telephone at home and verified that I am speaking with the correct person using two identifiers.   I discussed the limitations, risks, security and privacy concerns of performing an evaluation and management service by telephone and the availability of in person appointments. I also discussed with the patient that there may be a patient responsible charge related to this service. The patient expressed understanding and agreed to proceed.  Patient declined interpreter services and selected to use family member for interpretation.  Initially her husband started the call but he had to leave and a female family member (sister-in-law?) translated.  Tonya [name] was present and answered questions throughout the call today.    Subjective:  Tonya [name] is a 33 y.o. H0Q6578 at [redacted]w[redacted]d being followed for ongoing prenatal care.  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  Patient reports heartburn. Reports fetal movement. Denies any contractions, bleeding or leaking of fluid.   The following portions of the patient's history were reviewed and updated as appropriate: allergies, current medications, past family history, past medical history, past social history, past surgical history and problem list.   Objective:   General:  Alert, oriented and cooperative.   Mental Status: Normal mood and affect perceived. Normal judgment and thought content.  Rest of physical exam deferred due to type of encounter  Assessment and Plan:  Pregnancy: G6P5005 at [redacted]w[redacted]d 1. Heartburn during pregnancy in second trimester --Small frequent meals, avoid spicy  or fried foods, avoid eating before lying down. - famotidine (PEPCID) 20 MG tablet; Take 1 tablet (20 mg total) by mouth 2 (two) times daily as needed for heartburn or indigestion.  Dispense: 60 tablet; Refill: 2  2. Supervision of other normal pregnancy, antepartum --Pt reports good fetal movement, denies cramping, LOF, or vaginal bleeding --Anticipatory guidance about next visits/weeks of pregnancy given.   3. Abnormal antenatal ultrasound --Placental lakes noted, repeat US scheduled 12/15 to evaluate, at risk for accreda.  Preterm labor symptoms and general obstetric precautions including but not limited to vaginal bleeding, contractions, leaking of fluid and fetal movement were reviewed in detail with the patient.  I discussed the assessment and treatment plan with the patient. The patient was provided an opportunity to ask questions and all were answered. The patient agreed with the plan and demonstrated an understanding of the instructions. The patient was advised to call back or seek an in-person office evaluation/go to [facility] at [facility] for any urgent or concerning symptoms. Please refer to After Visit Summary for other counseling recommendations.   I provided 10 minutes of non-face-to-face time during this encounter.  No follow-ups on file.  Future Appointments  Date Time Provider [place]  08/10/2019  1:15 PM [place] MFC-US  08/10/2019  1:15 PM [facility]-MFC [place] 4 WH-MFCUS MFC-US    Annaly Skop [place], [place] for [employer], [place]

## 2019-08-10 ENCOUNTER — Ambulatory Visit (HOSPITAL_COMMUNITY): Admission: RE | Admit: 2019-08-10 | Payer: Medicaid Other | Source: Ambulatory Visit

## 2019-08-10 ENCOUNTER — Ambulatory Visit (HOSPITAL_COMMUNITY): Payer: Medicaid Other

## 2019-08-27 NOTE — L&D Delivery Note (Signed)
Delivery Note At 1138 a viable boy infant was delivered via SVD, presentation: OA. APGAR: 8, 9; weight 5'15.   Placenta status: spontaneously delivered intact with gentle cord traction. Fundus firm with massage and Pitocin.   Anesthesia: local Lacerations: 2nd degree perineal, peri-clitoral- hemostatic Suture used for repair: 2-0 Vicryl rapide Est. Blood Loss (mL): 53 Placenta to LD Complications nuchal x1, delivered through; particulate MSF Cord ph n/a   Mom to postpartum. Baby to Couplet care / Skin to Skin.    Donette Larry, CNM 11/22/2019 12:04 PM

## 2019-08-31 ENCOUNTER — Ambulatory Visit (HOSPITAL_COMMUNITY): Payer: Medicaid Other | Admitting: *Deleted

## 2019-08-31 ENCOUNTER — Other Ambulatory Visit: Payer: Self-pay

## 2019-08-31 ENCOUNTER — Encounter (HOSPITAL_COMMUNITY): Payer: Self-pay | Admitting: *Deleted

## 2019-08-31 ENCOUNTER — Ambulatory Visit (INDEPENDENT_AMBULATORY_CARE_PROVIDER_SITE_OTHER): Payer: Medicaid Other | Admitting: Advanced Practice Midwife

## 2019-08-31 ENCOUNTER — Ambulatory Visit (HOSPITAL_COMMUNITY)
Admission: RE | Admit: 2019-08-31 | Discharge: 2019-08-31 | Disposition: A | Discharge status: Home / Self Care | Payer: Medicaid Other | Source: Ambulatory Visit | Attending: Obstetrics | Admitting: Obstetrics

## 2019-08-31 DIAGNOSIS — Z3480 Encounter for supervision of other normal pregnancy, unspecified trimester: Secondary | ICD-10-CM

## 2019-08-31 DIAGNOSIS — O402XX0 Polyhydramnios, second trimester, not applicable or unspecified: Secondary | ICD-10-CM

## 2019-08-31 DIAGNOSIS — O09522 Supervision of elderly multigravida, second trimester: Secondary | ICD-10-CM

## 2019-08-31 DIAGNOSIS — O26892 Other specified pregnancy related conditions, second trimester: Secondary | ICD-10-CM | POA: Diagnosis present

## 2019-08-31 DIAGNOSIS — R12 Heartburn: Secondary | ICD-10-CM | POA: Insufficient documentation

## 2019-08-31 DIAGNOSIS — O99212 Obesity complicating pregnancy, second trimester: Secondary | ICD-10-CM

## 2019-08-31 DIAGNOSIS — Z362 Encounter for other antenatal screening follow-up: Secondary | ICD-10-CM | POA: Diagnosis not present

## 2019-08-31 DIAGNOSIS — O0942 Supervision of pregnancy with grand multiparity, second trimester: Secondary | ICD-10-CM

## 2019-08-31 DIAGNOSIS — Z789 Other specified health status: Secondary | ICD-10-CM

## 2019-08-31 DIAGNOSIS — O43109 Malformation of placenta, unspecified, unspecified trimester: Secondary | ICD-10-CM | POA: Diagnosis present

## 2019-08-31 DIAGNOSIS — Z3A27 27 weeks gestation of pregnancy: Secondary | ICD-10-CM

## 2019-08-31 DIAGNOSIS — O26842 Uterine size-date discrepancy, second trimester: Secondary | ICD-10-CM

## 2019-08-31 DIAGNOSIS — O3662X0 Maternal care for excessive fetal growth, second trimester, not applicable or unspecified: Secondary | ICD-10-CM | POA: Insufficient documentation

## 2019-08-31 MED ORDER — PANTOPRAZOLE SODIUM 40 MG PO TBEC: 40.0000 mg | DELAYED_RELEASE_TABLET | Freq: Every day | ORAL | 5 refills | Status: DC

## 2019-08-31 NOTE — Progress Notes (Signed)
   PRENATAL VISIT NOTE  Subjective:  Tonya [name] is a 34 y.o. G6P5005 at [redacted]w[redacted]d being seen today for ongoing prenatal care.  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  Patient reports heartburn.  Contractions: Not present. Vag. Bleeding: None.  Movement: Present. Denies leaking of fluid.   The following portions of the patient's history were reviewed and updated as appropriate: allergies, current medications, past family history, past medical history, past social history, past surgical history and problem list.   Objective:  There were no vitals filed for this visit.  Fetal Status: Fetal Heart Rate (bpm): 135   Movement: Present     General:  Alert, oriented and cooperative. Patient is in no acute distress.  Skin: Skin is warm and dry. No rash noted.   Cardiovascular: Normal heart rate noted  Respiratory: Normal respiratory effort, no problems with respiration noted  Abdomen: Soft, gravid, appropriate for gestational age.  Pain/Pressure: Absent     Pelvic: Cervical exam deferred        Extremities: Normal range of motion.  Edema: None  Mental Status: Normal mood and affect. Normal behavior. Normal judgment and thought content.   Assessment and Plan:  Pregnancy: G6P5005 at [redacted]w[redacted]d 1. Supervision of other normal pregnancy, antepartum --Anticipatory guidance about next visits/weeks of pregnancy given. --Next visit this week for GTT, then in 2 weeks virtual  2. Heartburn during pregnancy in second trimester -Pt reports upper abdominal pain after eating, resolves after rest, sometimes wakes up with the pain.  Worse after certain foods.  --Went to pick up Pepcid Rx but was not at pharmacy.  Discussed starting Protonix now with severe  symptoms, and using Pepcid twice per day until symptoms improved but pt prefers simpler regimen so will start Protonix only today.  May take a few days for symptoms to improve. Pt states understanding.  - pantoprazole (PROTONIX) 40 MG tablet; Take 1 tablet (40 mg total) by mouth daily.  Dispense: 30 tablet; Refill: 5  3. Uterine size date discrepancy pregnancy, second trimester --FH today is 33 at [redacted]w[redacted]d  4. Excessive fetal growth affecting management of pregnancy in second trimester, single or unspecified fetus --EFW 99%tile at anatomy US, follow up today without final results to review.  Per pt, EFW is large and there is too much fluid.   --Pt scheduled for GTT this week, and for growth [place] in 4 weeks per MFM  5. Language barrier affecting health care --Video interpreter used with Somali language for all communication  Preterm labor symptoms and general obstetric precautions including but not limited to vaginal bleeding, contractions, leaking of fluid and fetal movement were reviewed in detail with the patient. Please refer to After Visit Summary for other counseling recommendations.   Return in about 2 weeks (around 09/14/2019).  Future Appointments  Date Time Provider Department Center  09/30/2019  2:45 PM WH-MFC [place] 2 WH-MFCUS MFC-US    Sharen Counter, CNM

## 2019-08-31 NOTE — Patient Instructions (Signed)
Laabjeex Nichola Sizer Uurka Leh  Laabjeex waa nooc ka mid ah xanuun ama raaxo la'aan ku dhici [place] cunaha ama laabta. Waxaa badanaa lagu sifeeyaa inuu yahay dareen gubasho. Laabjeex waa wax caadi ah inta lagu guda jiro Donato Schultz maxaa yeelay:  Hormoonka (progesterone) ee la sii daayo Nichola Sizer lagu guda jiro Donato Schultz ayaa dabcin kara waalka (mashiinka hunguriga hoose, ama LES) ee ka soocaa hunguriga caloosha. Larose Hires waxay u oggolaaneysaa aashitada cal[place] inay u soo gudubto hunguriga, taasoo [place] laab xanuun.  Ilmo-galeenka ayaa sii weynaanaya oo ku riixaya caloosha, taas oo aashito badan ku rida hunguriga. Ahmed Prima run gaar ahaan heerarka dambe ee Donato Schultz.  Laabjeex inta badan wuu tagaa ama wuu fiicnaadaa umusha kadib.  Maxaa sababa?  Laabjeexa waxaa sababa aashitada caloosha ee ku soo laabata hunguriga (reflux). Reflux waxaa sababi kara:  Beddelidda heerarka hoormoonka.  Cunnooyinka waaweyn.  Cuntooyinka iyo cabitaannada qaarkood, sida [place], [place], [place], iyo basbaaska.  Jimicsi.  Wax soo saarka aashitada caloosha oo [place].  Maxaa Hester Mates?  Waxay u badan tahay inaad la kulanto laab jeex [place] lagu guda jiro Donato Schultz haddii aad:  Lahaan laab ka hor intaanad uur yeelan.  Uur yeelatay wax ka badan hal jeer oo hore.  Aad u cayilan tahay ama cayilan tahay.  Suurtagalnimada inaad laabjeexdo sidoo kale way sii [place] markii aad u sii fogaato uurkaaga, gaar Yahoo lagu jiro saddexda bilood ee ugu dambeysa.  Waa maxay calaamadaha ama astaamuhu?  Calaamadaha xaaladdan waxaa ka mid ah:  Kandyce Rud ah oo ka socda laabta ama cunaha hoose.  Dhadhaminta qadhaadh ee afka.  Qufaca.  Dhibaatooyinka wax liqidda.  Matagid.  Codka kulul.  Neefta  Calaamadaha ayaa ka sii dari kara markaad jiifto ama foorarsato. Calaamadaha badanaa way ka sii daran yihiin habeenkii.  Sidee lagu ogaadaa tan?  Leory Plowman waxaa lagu ogaadaa iyadoo ku saleysan:  Taariikhdaada  caafimaad.  Calaamadahaaga.  Baadhitaanada dhiigga si loo hubiyo nooc bakteeriya ah oo la xidhiidha laabjeexa.  Haddii aad qaadaneysid dawada laabjeexa waxay yareyneysaa astaamahaaga.  Baadhitaanka caloosha iyo hunguriga iyadoo la isticmaalayo tuubo leh nal iyo kamarad ku taal dhamaadka (endoscopy).  Sidee loo daaweeyaa tan  Daaweyntu way kala duwan tahay iyadoo ku xiran hadba sida ay calaamadahaagu u daran yihiin. Bixiyaha xanaanada caafimaadkaaga ayaa kugula talin kara:  Daawooyinka miiska lagu iibsado ee miiska laga iibsado (daawooyinka antacids ama aashitada yareeya) ee qalbi jilicsan.  Daawooyinka laguu qoro si loo yareeyo aashitada caloosha ama loo ilaaliyo xuubka calooshaada.  Isbeddelada qaarkood ee ku saabsan cuntadaada.  Sare u qaadida madaxa sariirtaada si ay uga sarreyso cagta sariirta. Larose Hires waxay kaa caawinaysaa kahortagga aashitada caloosha inay ku soo laabato hunguriga [place].  Raac tilmaamahan guriga:  Cunista iyo [place] aalkolo [place].  Aqoonso cuntooyinka iyo cabbitaannada astaamahaaga ka sii daraya, iskana ilaali.  Cabitaanka aad u baahan tahay inaad iska ilaaliso waxaa ka mid ah:  Qaxwaha iyo shaaha (leh ama kafee aan lahayn).  Cabbitaanka tamarta iyo cabitaannada isboortiga.  Cabitaannada kaarboon-kaarboon ama soodhaha.  Casiirka miraha liinta.  Cunnooyinka aad rabto inaad iska ilaaliso waxaa ka mid ah:  Shukulaatada iyo kookaha.  Peppermint iyo dhadhanka reexaanta.  Garlic, basasha, iyo malayga.  Cunnooyinka basbaaska leh iyo kuwa aashito leh, oo ay ku jiraan basbaaska, budada basbaaska, budada curry, khal, sooska kulul, iyo sooska dubbe.  Midhaha liinta, sida liinta, liinta, iyo liinta.  Cuntooyinka yaanyo ku saleysan, sida suugada guduudan, chili, iyo salsa.  Cunnooyinka shiilan iyo kuwa dufanka badan, sida digaagga, shiilannada faranjiga, baradhada baradhada, iyo labiska dufanka  badan.  Hilibka dufanka  badan, sida eeyaha kulul, jarista qabow, sausage, ham, iyo hilib doofaar.  ONGEXBMWUX caanaha laga sameeyo ee dufanka badan leh, sida caanaha, subagga, iyo farmaajada.  Cun cunno yar oo soo noqnoqda ah halkii aad ka cuni lahayd cuntooyin [place].  Ka fogow cabitaanka badan ee dareeraha ah cuntadaada.  Ka fogow cunista cuntada [employer] lagu jiro 2-3 saacadood ka hor [place].  Iska ilaali inaad jiifsatid isla marka aad wax cuntid ka dib.  Jimicsi ha sameynin isla marka aad cuntid kadib.  Daawooyin  Qaado warqad dhakhtar oo lagaa iibsan karo iyo dawooyinka dhakhtarku qoro oo keliya sida uu ku sheegay bixiyaha xanaanada caafimaadkaaga.  Ha u qaadan asbiriin, ibuprofen, ama NSAIDs kale haddii bixiyaha xanaanada caafimaadkaagu kuu sheego inaad sidaa sameyso mooyee.  Waxaa lagu farayaa inaad iska ilaaliso daawooyinka ku jira sodium bicarbonate.  Tilmaamaha guud    Haddii lagu tilmaamo, kor u qaad madaxa sariirtaada qiyaastii 6 inji (15 cm) adoo hoosta ka dhigaya lugaha hoostooda. Ku seexashada barkimo badan ayaan si wax ku ool ah u yareynin laabjeexa maxaa yeelay waxay bedeshaa oo keliya booska madaxaaga.  Ha isticmaalin wax alaab ah oo ay ku jiraan nikotiin ama tubaako, sida sigaarka iyo sigaarka elektarooniga ah. Haddii aad u baahan tahay caawimaad joojinta, weydii daryeel caafimaad bixiyahaaga.  Xiro dhar fidsan.  Isku day inaad yareyso buuqaaga, sida yoga ama fikirka. Haddii aad u baahan tahay caawimaad maaraynta culeyska fekerka, weydii daryeel caafimaad bixiyahaaga.  Joogto miisaan caafimaad leh. Haddii aad miisaan culus tahay, la shaqee daryeel caafimaad bixiyahaaga si aad si ammaan ah u lumiso miisaankaaga.  Hayso dhammaan booqashooyinka dabagalka ah sida uu kuugu sheegay bixiyahaaga daryeelka caafimaad. Tani waa muhiim.  La xiriir bixiye daryeel caafimaad haddii:  Waxaad yeelataa calaamado cusub.  Calaamadahaaga kuma hagaagaan  daaweynta.  Waxaad leedahay miisaan lumis aan la sharraxin.  Waxa kugu adag inaad wax liqdo.  Waxaad samaynaysaa dhawaaqyo aad u badan markaad neefsanayso (hindhiso).  Waxaad leedahay qufac aan soconaynin.  Waxaad qabtaa laab xanuun joogto ah in ka badan 2 toddobaad.  Waxaad leedahay lallabbo ama matag aan ka soo raynayn daaweynta.  Calool xanuun baa ku haysa.   Caawinaad raadso isla markiiba haddii:  Waxaad leedahay xabad xanuun daran oo ku faafaya gacantaada, qoorta, ama daanka.  Waxaad dareemeysaa dhidid, wareer, ama madax-wareer.  Neefsasho ayaa kugu haysa.  Xanuun ayaa ku haya markaad wax liqayso.  Lyndon Code, mataguna wuxuu umuuqdaa dhiig ama kafee.  Saxaradaadu waa dhiig ama madow.  Macluumaadkan looma jeedin in lagu beddelo talobixinta uu ku siiyo bixiyahaaga daryeelka caafimaad. Hubso inaad kala hadasho wixii su'aalo ah ee aad hayso daryeelahaaga caafimaad. Dukumeentiga dib loo eegay: 11/10/2017 Dukumentiga Dib loo eegay: 04/29/2016 [employer].

## 2019-09-01 ENCOUNTER — Other Ambulatory Visit (HOSPITAL_COMMUNITY): Payer: Self-pay | Admitting: *Deleted

## 2019-09-01 DIAGNOSIS — O403XX0 Polyhydramnios, third trimester, not applicable or unspecified: Secondary | ICD-10-CM

## 2019-09-02 ENCOUNTER — Other Ambulatory Visit: Payer: Self-pay

## 2019-09-02 ENCOUNTER — Other Ambulatory Visit: Payer: Medicaid Other

## 2019-09-02 DIAGNOSIS — Z3480 Encounter for supervision of other normal pregnancy, unspecified trimester: Secondary | ICD-10-CM

## 2019-09-03 LAB — CBC
Hematocrit: 34.1 % (ref 34.0–46.6)
Hemoglobin: 11.1 g/dL (ref 11.1–15.9)
MCH: 27.5 pg (ref 26.6–33.0)
MCHC: 32.6 g/dL (ref 31.5–35.7)
MCV: 84 fL (ref 79–97)
Platelets: 181 10*3/uL (ref 150–450)
RBC: 4.04 x10E6/uL (ref 3.77–5.28)
RDW: 13.6 % (ref 11.7–15.4)
WBC: 5.1 10*3/uL (ref 3.4–10.8)

## 2019-09-03 LAB — RPR: RPR Ser Ql: NONREACTIVE

## 2019-09-03 LAB — HIV ANTIBODY (ROUTINE TESTING W REFLEX): HIV Screen 4th Generation wRfx: NONREACTIVE

## 2019-09-03 LAB — GLUCOSE TOLERANCE, 2 HOURS W/ 1HR
Glucose, 1 hour: 105 mg/dL (ref 65–179)
Glucose, 2 hour: 104 mg/dL (ref 65–152)
Glucose, Fasting: 79 mg/dL (ref 65–91)

## 2019-09-07 IMAGING — US OBSTETRIC <14 WK ULTRASOUND
1 series · 15 of 28 positions shown · non-contrast
Comparison: None.

CLINICAL DATA: 33-year-old pregnant female with vaginal discharge.
LMP: 02/26/2019 corresponding to an estimated gestational age of 5
weeks, 5 days.

EXAM:
OBSTETRIC <14 WK US AND TRANSVAGINAL OB US
TECHNIQUE: Both transabdominal and transvaginal ultrasound examinations were
performed for complete evaluation of the gestation as well as the
maternal uterus, adnexal regions, and pelvic cul-de-sac.
Transvaginal technique was performed to assess early pregnancy.

[Series 1: obstetric <14 wk ultrasound · 15 of 50 slices shown]
[im 1/50]
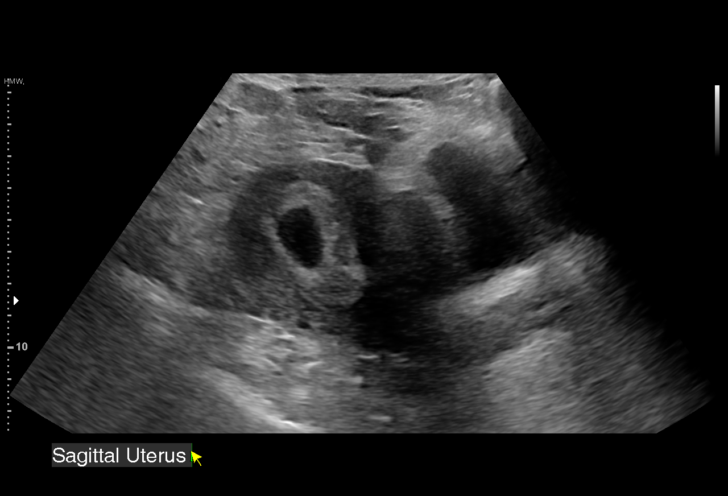
[im 4/50]
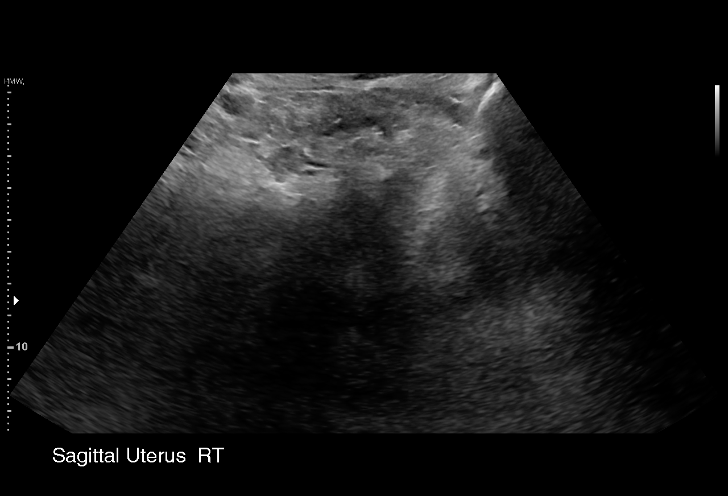
[im 8/50]
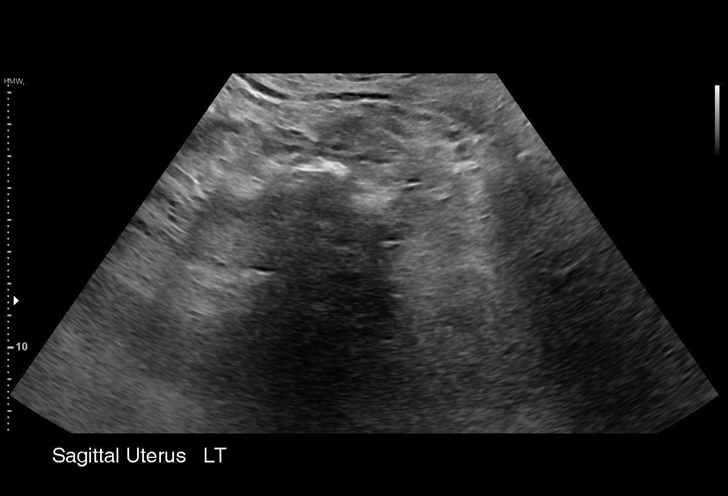
[im 11/50]
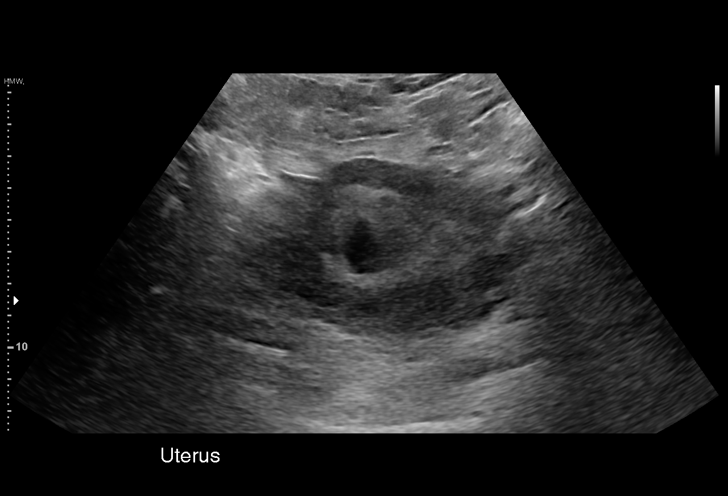
[im 15/50]
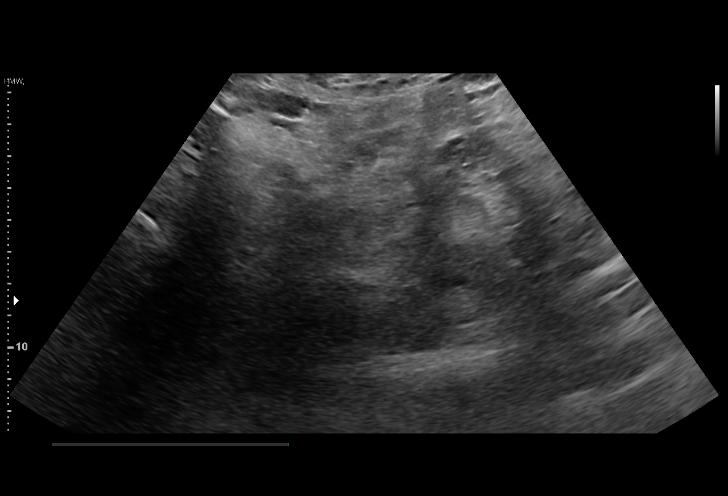
[im 19/50]
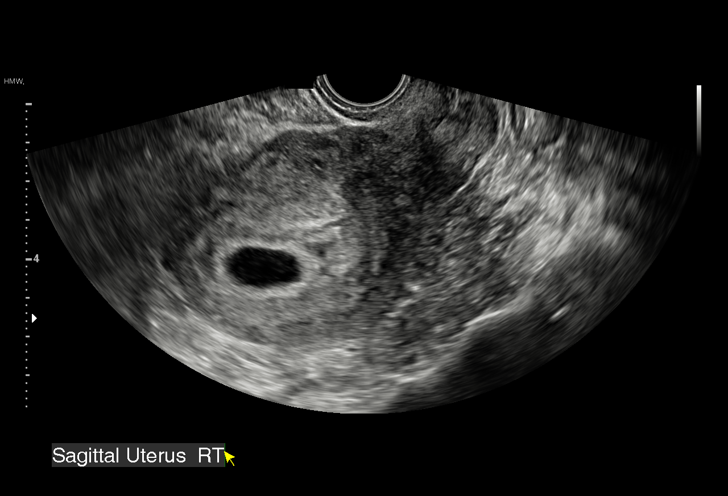
[im 22/50]
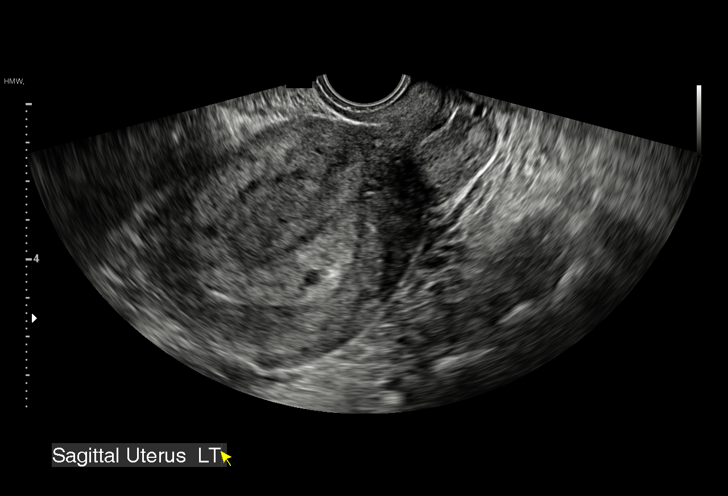
[im 26/50]
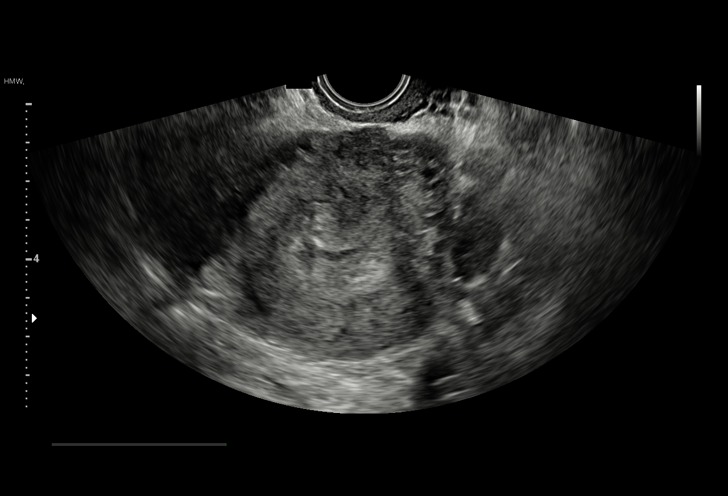
[im 28/50]
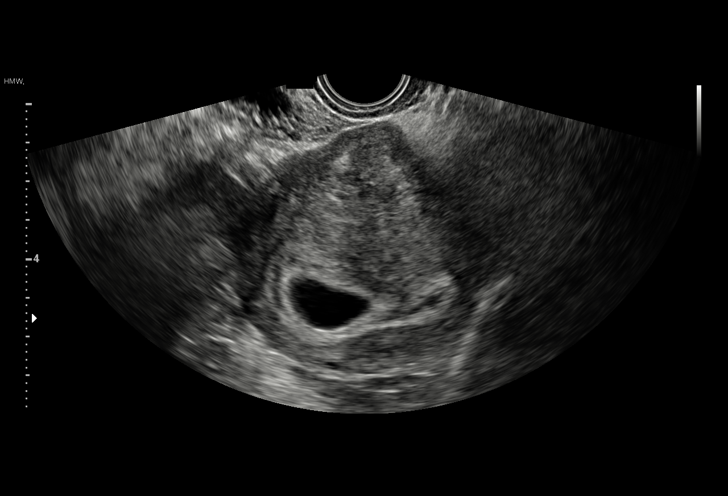
[im 31/50]
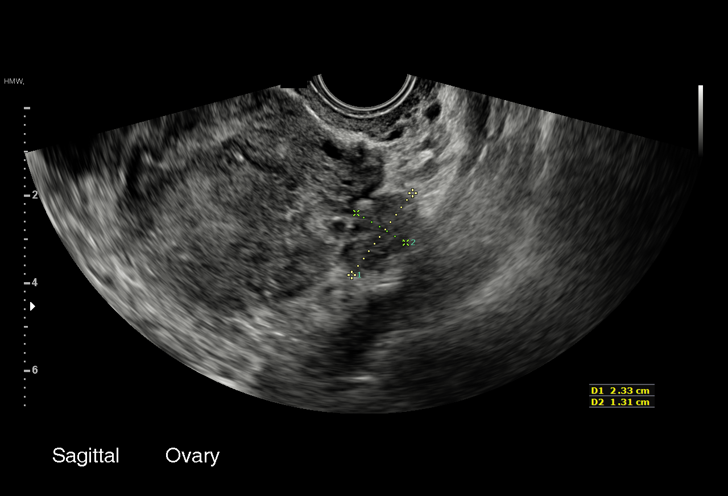
[im 35/50]
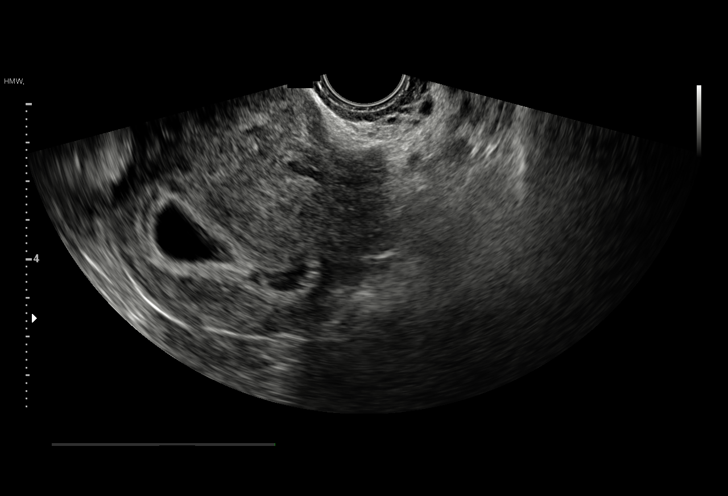
[im 39/50]
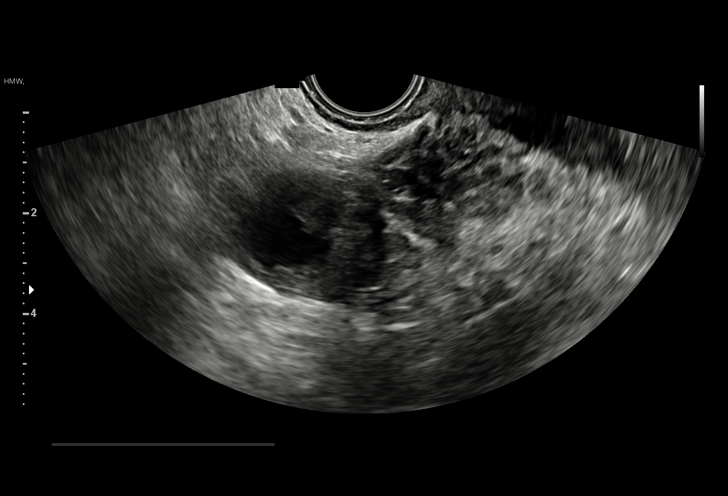
[im 42/50]
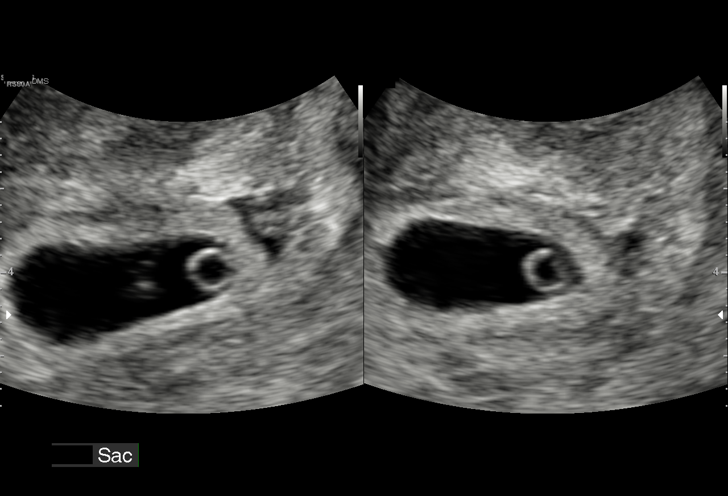
[im 46/50]
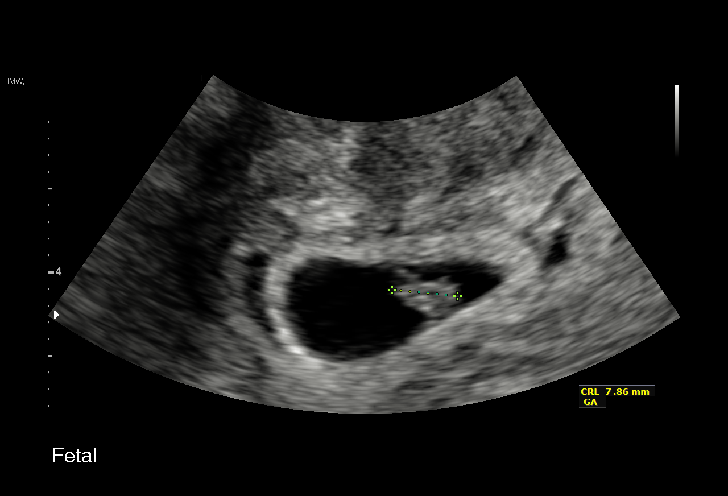
[im 50/50]
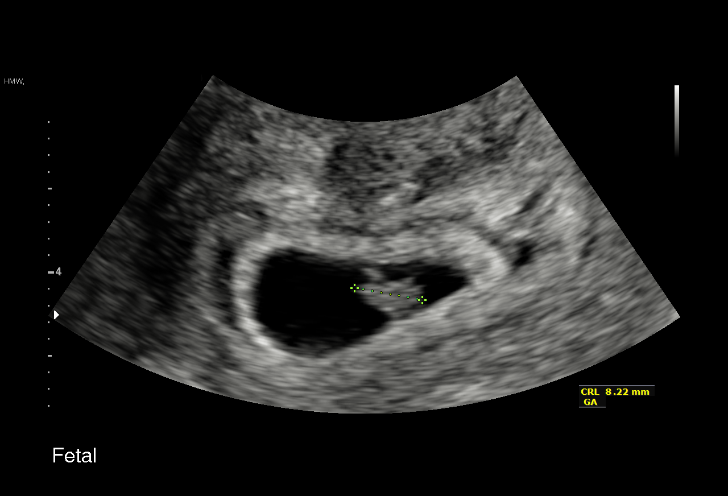

[15 of 28 positions shown; findings below may reference images not displayed]

FINDINGS: Intrauterine gestational sac: Single intrauterine gestational sac.

Yolk sac:  Seen

Embryo:  Present

Cardiac Activity: Detected

Heart Rate: 147 bpm

CRL:  8 mm   6 w   5 d                  US EDC: 11/26/2019

Subchorionic hemorrhage:  None visualized.

Maternal uterus/adnexae: The maternal ovaries are unremarkable.
There is a corpus luteum in the right ovary.
IMPRESSION: Single live intrauterine pregnancy with an estimated gestational age
of 6 weeks, 5 days.

## 2019-09-15 ENCOUNTER — Encounter: Payer: Self-pay | Admitting: Obstetrics and Gynecology

## 2019-09-15 ENCOUNTER — Telehealth (INDEPENDENT_AMBULATORY_CARE_PROVIDER_SITE_OTHER): Payer: Medicaid Other | Admitting: Obstetrics and Gynecology

## 2019-09-15 DIAGNOSIS — R102 Pelvic and perineal pain: Secondary | ICD-10-CM

## 2019-09-15 DIAGNOSIS — O26892 Other specified pregnancy related conditions, second trimester: Secondary | ICD-10-CM

## 2019-09-15 DIAGNOSIS — Z3480 Encounter for supervision of other normal pregnancy, unspecified trimester: Secondary | ICD-10-CM

## 2019-09-15 DIAGNOSIS — O403XX0 Polyhydramnios, third trimester, not applicable or unspecified: Secondary | ICD-10-CM

## 2019-09-15 DIAGNOSIS — R12 Heartburn: Secondary | ICD-10-CM

## 2019-09-15 DIAGNOSIS — Z789 Other specified health status: Secondary | ICD-10-CM

## 2019-09-15 DIAGNOSIS — Z603 Acculturation difficulty: Secondary | ICD-10-CM

## 2019-09-15 DIAGNOSIS — O26899 Other specified pregnancy related conditions, unspecified trimester: Secondary | ICD-10-CM

## 2019-09-15 NOTE — Progress Notes (Signed)
Virtual Rob   [phone] [employer]  Pt husband answered. Not able to reach pt cell [phone]

## 2019-09-15 NOTE — Progress Notes (Signed)
Unable to reach patient for virtual visit after several attempts

## 2019-09-29 ENCOUNTER — Telehealth (INDEPENDENT_AMBULATORY_CARE_PROVIDER_SITE_OTHER): Payer: Medicaid Other | Admitting: Obstetrics and Gynecology

## 2019-09-29 ENCOUNTER — Encounter: Payer: Self-pay | Admitting: Obstetrics and Gynecology

## 2019-09-29 VITALS — BP 91/71 | HR 81

## 2019-09-29 DIAGNOSIS — O26892 Other specified pregnancy related conditions, second trimester: Secondary | ICD-10-CM

## 2019-09-29 DIAGNOSIS — Z3483 Encounter for supervision of other normal pregnancy, third trimester: Secondary | ICD-10-CM

## 2019-09-29 DIAGNOSIS — Z3A31 31 weeks gestation of pregnancy: Secondary | ICD-10-CM

## 2019-09-29 DIAGNOSIS — O403XX0 Polyhydramnios, third trimester, not applicable or unspecified: Secondary | ICD-10-CM

## 2019-09-29 DIAGNOSIS — O26893 Other specified pregnancy related conditions, third trimester: Secondary | ICD-10-CM

## 2019-09-29 DIAGNOSIS — R12 Heartburn: Secondary | ICD-10-CM

## 2019-09-29 DIAGNOSIS — Z789 Other specified health status: Secondary | ICD-10-CM

## 2019-09-29 DIAGNOSIS — Z3480 Encounter for supervision of other normal pregnancy, unspecified trimester: Secondary | ICD-10-CM

## 2019-09-29 NOTE — Progress Notes (Signed)
   TELEHEALTH OBSTETRICS PRENATAL VIRTUAL VIDEO VISIT ENCOUNTER NOTE  Provider location: [facility] for [employer] [facility]   I connected with [facility] on 09/29/19 at  3:30 PM EST by MyChart Video Encounter at home and verified that I am speaking with the correct person using two identifiers.   I discussed the limitations, risks, security and privacy concerns of performing an evaluation and management service virtually and the availability of in person appointments. I also discussed with the patient that there may be a patient responsible charge related to this service. The patient expressed understanding and agreed to proceed. Subjective:  Tonya [name] is a 34 y.o. [id number] at [redacted]w[redacted]d being seen today for ongoing prenatal care.  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  Patient reports no complaints.  Contractions: Not present. Vag. Bleeding: None.  Movement: Present. Denies any leaking of fluid.   The following portions of the patient's history were reviewed and updated as appropriate: allergies, current medications, past family history, past medical history, past social history, past surgical history and problem list.   Objective:   Vitals:   09/29/19 1343  BP: 91/71  Pulse: 81    Fetal Status:     Movement: Present     General:  Alert, oriented and cooperative. Patient is in no acute distress.  Respiratory: Normal respiratory effort, no problems with respiration noted  Mental Status: Normal mood and affect. Normal behavior. Normal judgment and thought content.  Rest of physical  exam deferred due to type of encounter  Imaging:   Assessment and Plan:  Pregnancy: G6P5005 at [redacted]w[redacted]d  1. Polyhydramnios affecting pregnancy in third trimester AFI 30 on 08/31/19 [place] Has f/u 09/30/19  2. Heartburn during pregnancy in second trimester  3. Supervision of other normal pregnancy, antepartum Placental lakes seen on early [place], no concern for accreta on 08/31/19 ultrasound  4. Language barrier Magazine features editor used   Preterm labor symptoms and general obstetric precautions including but not limited to vaginal bleeding, contractions, leaking of fluid and fetal movement were reviewed in detail with the patient. I discussed the assessment and treatment plan with the patient. The patient was provided an opportunity to ask questions and all were answered. The patient agreed with the plan and demonstrated an understanding of the instructions. The patient was advised to call back or seek an in-person office evaluation/go to MAU at [facility] for any urgent or concerning symptoms. Please refer to After Visit Summary for other counseling recommendations.   I provided 20 minutes of face-to-face time during this encounter.  Return in about 2 weeks (around 10/13/2019) for high OB, in person.  Future Appointments  Date Time Provider Department Center  09/29/2019  3:30 PM Conan Bowens, MD [facility] None  09/30/2019  2:45 PM WH-MFC [place] 2 WH-MFCUS MFC-US  09/30/2019  2:50 PM [facility] NURSE WH-MFC [facility]-US    Conan Bowens, MD Center for [facility], [facility]

## 2019-09-30 ENCOUNTER — Ambulatory Visit (HOSPITAL_COMMUNITY): Payer: Medicaid Other | Admitting: *Deleted

## 2019-09-30 ENCOUNTER — Other Ambulatory Visit: Payer: Self-pay

## 2019-09-30 ENCOUNTER — Encounter (HOSPITAL_COMMUNITY): Payer: Self-pay

## 2019-09-30 ENCOUNTER — Other Ambulatory Visit (HOSPITAL_COMMUNITY): Payer: Self-pay | Admitting: *Deleted

## 2019-09-30 ENCOUNTER — Ambulatory Visit (HOSPITAL_COMMUNITY)
Admission: RE | Admit: 2019-09-30 | Discharge: 2019-09-30 | Disposition: A | Discharge status: Home / Self Care | Payer: Medicaid Other | Source: Ambulatory Visit | Attending: Obstetrics | Admitting: Obstetrics

## 2019-09-30 DIAGNOSIS — R12 Heartburn: Secondary | ICD-10-CM | POA: Diagnosis present

## 2019-09-30 DIAGNOSIS — R102 Pelvic and perineal pain: Secondary | ICD-10-CM | POA: Insufficient documentation

## 2019-09-30 DIAGNOSIS — O99213 Obesity complicating pregnancy, third trimester: Secondary | ICD-10-CM | POA: Diagnosis not present

## 2019-09-30 DIAGNOSIS — Z3480 Encounter for supervision of other normal pregnancy, unspecified trimester: Secondary | ICD-10-CM | POA: Diagnosis present

## 2019-09-30 DIAGNOSIS — O09523 Supervision of elderly multigravida, third trimester: Secondary | ICD-10-CM | POA: Diagnosis not present

## 2019-09-30 DIAGNOSIS — Z3A31 31 weeks gestation of pregnancy: Secondary | ICD-10-CM

## 2019-09-30 DIAGNOSIS — O26892 Other specified pregnancy related conditions, second trimester: Secondary | ICD-10-CM

## 2019-09-30 DIAGNOSIS — O09529 Supervision of elderly multigravida, unspecified trimester: Secondary | ICD-10-CM

## 2019-09-30 DIAGNOSIS — O403XX0 Polyhydramnios, third trimester, not applicable or unspecified: Secondary | ICD-10-CM

## 2019-09-30 DIAGNOSIS — Z362 Encounter for other antenatal screening follow-up: Secondary | ICD-10-CM

## 2019-09-30 DIAGNOSIS — O26899 Other specified pregnancy related conditions, unspecified trimester: Secondary | ICD-10-CM | POA: Diagnosis present

## 2019-10-11 ENCOUNTER — Ambulatory Visit (HOSPITAL_COMMUNITY): Payer: Medicaid Other

## 2019-10-11 ENCOUNTER — Ambulatory Visit (HOSPITAL_COMMUNITY): Payer: Medicaid Other | Attending: Obstetrics and Gynecology

## 2019-10-11 ENCOUNTER — Encounter (HOSPITAL_COMMUNITY): Payer: Self-pay

## 2019-10-14 ENCOUNTER — Encounter: Payer: Medicaid Other | Admitting: Obstetrics and Gynecology

## 2019-10-18 ENCOUNTER — Ambulatory Visit (HOSPITAL_COMMUNITY): Payer: Medicaid Other | Admitting: *Deleted

## 2019-10-18 ENCOUNTER — Ambulatory Visit (HOSPITAL_COMMUNITY)
Admission: RE | Admit: 2019-10-18 | Discharge: 2019-10-18 | Disposition: A | Discharge status: Home / Self Care | Payer: Medicaid Other | Source: Ambulatory Visit | Attending: Obstetrics and Gynecology | Admitting: Obstetrics and Gynecology

## 2019-10-18 ENCOUNTER — Other Ambulatory Visit: Payer: Self-pay

## 2019-10-18 ENCOUNTER — Encounter (HOSPITAL_COMMUNITY): Payer: Self-pay | Admitting: *Deleted

## 2019-10-18 DIAGNOSIS — R12 Heartburn: Secondary | ICD-10-CM | POA: Diagnosis present

## 2019-10-18 DIAGNOSIS — O403XX0 Polyhydramnios, third trimester, not applicable or unspecified: Secondary | ICD-10-CM

## 2019-10-18 DIAGNOSIS — O26899 Other specified pregnancy related conditions, unspecified trimester: Secondary | ICD-10-CM | POA: Diagnosis present

## 2019-10-18 DIAGNOSIS — Z3480 Encounter for supervision of other normal pregnancy, unspecified trimester: Secondary | ICD-10-CM | POA: Diagnosis present

## 2019-10-18 DIAGNOSIS — R102 Other specified pregnancy related conditions, unspecified trimester: Secondary | ICD-10-CM

## 2019-10-18 DIAGNOSIS — O26892 Other specified pregnancy related conditions, second trimester: Secondary | ICD-10-CM | POA: Insufficient documentation

## 2019-10-18 DIAGNOSIS — O09529 Supervision of elderly multigravida, unspecified trimester: Secondary | ICD-10-CM

## 2019-10-18 DIAGNOSIS — Z3A34 34 weeks gestation of pregnancy: Secondary | ICD-10-CM | POA: Diagnosis not present

## 2019-10-20 ENCOUNTER — Encounter: Payer: Self-pay | Admitting: Obstetrics and Gynecology

## 2019-10-20 ENCOUNTER — Ambulatory Visit (INDEPENDENT_AMBULATORY_CARE_PROVIDER_SITE_OTHER): Payer: Medicaid Other | Admitting: Obstetrics and Gynecology

## 2019-10-20 ENCOUNTER — Other Ambulatory Visit: Payer: Self-pay

## 2019-10-20 VITALS — BP 111/74 | HR 84 | Wt 212.0 lb

## 2019-10-20 DIAGNOSIS — Z3A34 34 weeks gestation of pregnancy: Secondary | ICD-10-CM

## 2019-10-20 DIAGNOSIS — Z3480 Encounter for supervision of other normal pregnancy, unspecified trimester: Secondary | ICD-10-CM

## 2019-10-20 DIAGNOSIS — O403XX0 Polyhydramnios, third trimester, not applicable or unspecified: Secondary | ICD-10-CM

## 2019-10-20 NOTE — Progress Notes (Signed)
Pt states no concerns today.

## 2019-10-20 NOTE — Progress Notes (Signed)
   PRENATAL VISIT NOTE  Subjective:  Tonya [name] is a 34 y.o. [id number] at [redacted]w[redacted]d being seen today for ongoing prenatal care.  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  Patient reports no complaints.  Contractions: Not present. Vag. Bleeding: None.  Movement: Present. Denies leaking of fluid.   The following portions of the patient's history were reviewed and updated as appropriate: allergies, current medications, past family history, past medical history, past social history, past surgical history and problem list.   Objective:   Vitals:   10/20/19 1610  BP: 111/74  Pulse: 84  Weight: 212 lb (96.2 kg)    Fetal Status: Fetal Heart Rate (bpm): 140 Fundal Height: 35 cm Movement: Present     General:  Alert, oriented and cooperative. Patient is in no acute distress.  Skin: Skin is warm and dry. No rash noted.   Cardiovascular: Normal heart rate noted  Respiratory: Normal respiratory effort, no problems with respiration noted  Abdomen: Soft, gravid, appropriate for gestational age.  Pain/Pressure: Absent     Pelvic: Cervical exam deferred        Extremities: Normal range of motion.  Edema: None  Mental Status: Normal mood and affect. Normal behavior. Normal judgment and thought content.   Assessment and Plan:  Pregnancy: G6P5005 at [redacted]w[redacted]d 1. Supervision of other normal pregnancy, antepartum Patient is doing well without complaints Cultures next visit  2. Polyhydramnios affecting pregnancy in third trimester Resolved on recent scan Scheduled for another ultrasound 10/25/19  3. Language  barrier Somali interpreter used  Preterm labor symptoms and general obstetric precautions including but not limited to vaginal bleeding, contractions, leaking of fluid and fetal movement were reviewed in detail with the patient. Please refer to After Visit Summary for other counseling recommendations.   Return in about 2 weeks (around 11/03/2019) for in person, ROB, GBS, Low risk.  Future Appointments  Date Time Provider Department Center  10/25/2019  3:45 PM [facility] NURSE WH-MFC [facility]-US  10/25/2019  3:45 PM WH-MFC [place] 2 WH-MFCUS M[facility]-US    Catalina Antigua, MD

## 2019-10-25 ENCOUNTER — Ambulatory Visit (HOSPITAL_COMMUNITY): Payer: Medicaid Other

## 2019-10-25 ENCOUNTER — Ambulatory Visit (HOSPITAL_COMMUNITY): Payer: Medicaid Other | Attending: Obstetrics and Gynecology

## 2019-11-04 ENCOUNTER — Other Ambulatory Visit (HOSPITAL_COMMUNITY)
Admission: RE | Admit: 2019-11-04 | Discharge: 2019-11-04 | Disposition: A | Discharge status: Home / Self Care | Payer: Medicaid Other | Source: Ambulatory Visit | Attending: Obstetrics and Gynecology | Admitting: Obstetrics and Gynecology

## 2019-11-04 ENCOUNTER — Ambulatory Visit (INDEPENDENT_AMBULATORY_CARE_PROVIDER_SITE_OTHER): Payer: Medicaid Other | Admitting: Obstetrics and Gynecology

## 2019-11-04 ENCOUNTER — Encounter: Payer: Self-pay | Admitting: Obstetrics and Gynecology

## 2019-11-04 ENCOUNTER — Other Ambulatory Visit: Payer: Self-pay

## 2019-11-04 VITALS — BP 127/83 | HR 87 | Wt 211.1 lb

## 2019-11-04 DIAGNOSIS — Z3A36 36 weeks gestation of pregnancy: Secondary | ICD-10-CM

## 2019-11-04 DIAGNOSIS — Z3480 Encounter for supervision of other normal pregnancy, unspecified trimester: Secondary | ICD-10-CM

## 2019-11-04 DIAGNOSIS — O403XX0 Polyhydramnios, third trimester, not applicable or unspecified: Secondary | ICD-10-CM

## 2019-11-04 DIAGNOSIS — Z789 Other specified health status: Secondary | ICD-10-CM

## 2019-11-04 NOTE — Progress Notes (Signed)
   PRENATAL VISIT NOTE  Subjective:  Tonya [name] is a 34 y.o. [id number] at [redacted]w[redacted]d being seen today for ongoing prenatal care.  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  Patient reports no complaints.  Contractions: Not present. Vag. Bleeding: None.  Movement: Present. Denies leaking of fluid.   The following portions of the patient's history were reviewed and updated as appropriate: allergies, current medications, past family history, past medical history, past social history, past surgical history and problem list.   Objective:   Vitals:   11/04/19 1518  BP: 127/83  Pulse: 87  Weight: 211 lb 1.6 oz (95.8 kg)    Fetal Status: Fetal Heart Rate (bpm): 150 Fundal Height: 36 cm Movement: Present  Presentation: Vertex  General:  Alert, oriented and cooperative. Patient is in no acute distress.  Skin: Skin is warm and dry. No rash noted.   Cardiovascular: Normal heart rate noted  Respiratory: Normal respiratory effort, no problems with respiration noted  Abdomen: Soft, gravid, appropriate for gestational age.  Pain/Pressure: Absent     Pelvic: Cervical exam performed Dilation: Fingertip Effacement (%): Thick Station: Ballotable  Extremities: Normal range of motion.  Edema: None  Mental Status: Normal mood and affect. Normal behavior. Normal judgment and thought content.   Assessment and Plan:  Pregnancy: G6P5005 at [redacted]w[redacted]d 1. Supervision of other normal pregnancy, antepartum Patient is doing well without complaints Cultures collected  2. Polyhydramnios affecting pregnancy in third  trimester Resolved on previous scan Patient missed 3/1 BPP. Will reschedule  3. Language barrier Somali interpreter used for encounter  Preterm labor symptoms and general obstetric precautions including but not limited to vaginal bleeding, contractions, leaking of fluid and fetal movement were reviewed in detail with the patient. Please refer to After Visit Summary for other counseling recommendations.   Return in about 1 week (around 11/11/2019) for in person, ROB, Low risk.  Future Appointments  Date Time Provider Department Center  11/10/2019 11:00 AM [facility] NURSE WH-MFC [facility]-US  11/10/2019 11:00 AM WH-MFC [place] 5 WH-MFCUS MFC-US  11/11/2019  2:40 PM Gerrit Heck, CNM [facility] None    Catalina Antigua, MD

## 2019-11-04 NOTE — Progress Notes (Signed)
Pt is here for ROB, [redacted]w[redacted]d.  

## 2019-11-05 LAB — CERVICOVAGINAL ANCILLARY ONLY
Chlamydia: NEGATIVE
Comment: NEGATIVE
Comment: NORMAL
Neisseria Gonorrhea: NEGATIVE

## 2019-11-06 LAB — STREP GP B NAA: Strep Gp B NAA: POSITIVE — AB

## 2019-11-08 ENCOUNTER — Encounter: Payer: Self-pay | Admitting: Obstetrics and Gynecology

## 2019-11-08 DIAGNOSIS — B951 Streptococcus, group B, as the cause of diseases classified elsewhere: Secondary | ICD-10-CM | POA: Insufficient documentation

## 2019-11-10 ENCOUNTER — Other Ambulatory Visit: Payer: Self-pay

## 2019-11-10 ENCOUNTER — Ambulatory Visit (HOSPITAL_COMMUNITY): Payer: Medicaid Other | Admitting: *Deleted

## 2019-11-10 ENCOUNTER — Encounter (HOSPITAL_COMMUNITY): Payer: Self-pay

## 2019-11-10 ENCOUNTER — Ambulatory Visit (HOSPITAL_COMMUNITY)
Admission: RE | Admit: 2019-11-10 | Discharge: 2019-11-10 | Disposition: A | Discharge status: Home / Self Care | Payer: Medicaid Other | Source: Ambulatory Visit | Attending: Obstetrics and Gynecology | Admitting: Obstetrics and Gynecology

## 2019-11-10 DIAGNOSIS — R12 Heartburn: Secondary | ICD-10-CM | POA: Insufficient documentation

## 2019-11-10 DIAGNOSIS — O99213 Obesity complicating pregnancy, third trimester: Secondary | ICD-10-CM

## 2019-11-10 DIAGNOSIS — O403XX0 Polyhydramnios, third trimester, not applicable or unspecified: Secondary | ICD-10-CM | POA: Diagnosis not present

## 2019-11-10 DIAGNOSIS — O09523 Supervision of elderly multigravida, third trimester: Secondary | ICD-10-CM | POA: Diagnosis not present

## 2019-11-10 DIAGNOSIS — Z362 Encounter for other antenatal screening follow-up: Secondary | ICD-10-CM | POA: Diagnosis not present

## 2019-11-10 DIAGNOSIS — Z3480 Encounter for supervision of other normal pregnancy, unspecified trimester: Secondary | ICD-10-CM | POA: Insufficient documentation

## 2019-11-10 DIAGNOSIS — O0943 Supervision of pregnancy with grand multiparity, third trimester: Secondary | ICD-10-CM

## 2019-11-10 DIAGNOSIS — E669 Obesity, unspecified: Secondary | ICD-10-CM

## 2019-11-10 DIAGNOSIS — O26892 Other specified pregnancy related conditions, second trimester: Secondary | ICD-10-CM | POA: Insufficient documentation

## 2019-11-10 DIAGNOSIS — Z3A37 37 weeks gestation of pregnancy: Secondary | ICD-10-CM

## 2019-11-10 DIAGNOSIS — B951 Streptococcus, group B, as the cause of diseases classified elsewhere: Secondary | ICD-10-CM | POA: Insufficient documentation

## 2019-11-10 DIAGNOSIS — O09529 Supervision of elderly multigravida, unspecified trimester: Secondary | ICD-10-CM | POA: Insufficient documentation

## 2019-11-11 ENCOUNTER — Ambulatory Visit (INDEPENDENT_AMBULATORY_CARE_PROVIDER_SITE_OTHER): Payer: Medicaid Other

## 2019-11-11 ENCOUNTER — Other Ambulatory Visit: Payer: Self-pay

## 2019-11-11 VITALS — BP 110/66 | HR 76 | Wt 208.0 lb

## 2019-11-11 DIAGNOSIS — O09523 Supervision of elderly multigravida, third trimester: Secondary | ICD-10-CM

## 2019-11-11 DIAGNOSIS — Z789 Other specified health status: Secondary | ICD-10-CM

## 2019-11-11 DIAGNOSIS — Z3480 Encounter for supervision of other normal pregnancy, unspecified trimester: Secondary | ICD-10-CM

## 2019-11-11 DIAGNOSIS — Z3A37 37 weeks gestation of pregnancy: Secondary | ICD-10-CM

## 2019-11-11 NOTE — Progress Notes (Signed)
Pt presents for ROB no complaints GBS positive

## 2019-11-11 NOTE — Progress Notes (Signed)
   Induction Assessment Scheduling Form: Fax to Women's L&D:  [phone]  [facility]                                                                                   DOB:  1986-03-19                                                            MRN:  [id number]                                                                     Phone #:   [phone]                         Provider:  Haskel Khan  GP:  [id number]                                                            Estimated Date of Delivery: 11/26/19  Dating Criteria: 6.5 wk [place]    Medical Indications for induction:  AMA Admission Date/Time:  November 25, 2019 at [facility] Gestational age on admission:  40 weeks   Filed Weights   11/11/19 1456  Weight: 208 lb (94.3 kg)   HIV:  Non Reactive (01/07 0930) GBS: --Lottie Dawson (03/11 0343)  Bishop score TBD   Method of induction(proposed):  Foley Bulb Outpt   Scheduling Provider Signature:  Cherre Robins, CNM                                            Today's Date:  11/11/2019

## 2019-11-11 NOTE — Progress Notes (Signed)
   PRENATAL VISIT NOTE  Subjective:  Tonya [name] is a 34 y.o. H3Z1696 at [redacted]w[redacted]d who presents today for routine prenatal care.  She is currently being monitored for supervision of a high-risk pregnancy with problems as listed below.  Patient has no pregnancy related concerns and endorses fetal movement.  She denies vaginal concerns including discharge, bleeding, leaking, itching, and burning. Patient reports intermittent contractions and denies abdominal cramping.   Patient Active Problem List   Diagnosis Date Noted  . Positive GBS test 11/08/2019  . Polyhydramnios affecting pregnancy in third trimester 09/15/2019  . Excessive fetal growth affecting management of mother in second trimester, antepartum 08/31/2019  . Heartburn during pregnancy in second trimester 08/03/2019  . Supervision of other normal pregnancy, antepartum 07/05/2019  . 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 Pelvic pain affecting pregnancy 04/07/2019  . Language barrier 10/16/2015    The following portions of the patient's history were reviewed and updated as appropriate: allergies, current medications, past family history, past medical history, past social history, past surgical history and problem list. Problem list updated.  Objective:   Vitals:   11/11/19 1456  BP: 110/66  Pulse: 76  Weight: 208 lb (94.3 kg)    Fetal Status:     Movement: Present     General:  Alert, oriented and cooperative. Patient is in no acute distress.  Skin: Skin is warm and dry.   Cardiovascular: Regular rate and rhythm.  Respiratory: Normal respiratory effort. CTA-Bilaterally  Abdomen: Soft, gravid, appropriate for gestational age.  Pelvic: Cervical exam deferred        Extremities: Normal range of motion.  Edema: None  Mental Status: Normal mood and affect. Normal behavior. Normal judgment and thought content.   Assessment and Plan:  Pregnancy: G6P5005 at [redacted]w[redacted]d  1. Supervision of other normal pregnancy,  antepartum -Discussed IOL at 40 weeks for AMA status. -Reviewed IOL process including office visit with NST day prior for foley bulb placement if appropriate. -Patient questions and concerns addressed. -Reviewed Labor Precautions -IOL form sent electronically and Orders S/H.  2. Language barrier -Interpreter used via video: Amina [id number]   Term labor symptoms and general obstetric precautions including but not limited to vaginal bleeding, contractions, leaking of fluid and fetal movement were reviewed with the patient.  Please refer to After Visit Summary for other counseling recommendations.  Return in 2 weeks (on 11/25/2019) for LR-ROB with FP Placement for MN IOL on 11/26/2019.  No future appointments.  Cherre Robins, CNM 11/11/2019, 3:18 PM

## 2019-11-16 ENCOUNTER — Telehealth (HOSPITAL_COMMUNITY): Payer: Self-pay | Admitting: *Deleted

## 2019-11-16 NOTE — Telephone Encounter (Signed)
[id number] 

## 2019-11-17 ENCOUNTER — Other Ambulatory Visit: Payer: Self-pay | Admitting: Family Medicine

## 2019-11-22 ENCOUNTER — Other Ambulatory Visit: Payer: Self-pay

## 2019-11-22 ENCOUNTER — Inpatient Hospital Stay (HOSPITAL_COMMUNITY)
Admission: AD | Admit: 2019-11-22 | Discharge: 2019-11-24 | DRG: 807 | Disposition: A | Discharge status: Home / Self Care | Payer: Medicaid Other | Attending: Obstetrics and Gynecology | Admitting: Obstetrics and Gynecology

## 2019-11-22 ENCOUNTER — Encounter (HOSPITAL_COMMUNITY): Payer: Self-pay | Admitting: Family Medicine

## 2019-11-22 DIAGNOSIS — E669 Obesity, unspecified: Secondary | ICD-10-CM | POA: Diagnosis present

## 2019-11-22 DIAGNOSIS — O3663X0 Maternal care for excessive fetal growth, third trimester, not applicable or unspecified: Secondary | ICD-10-CM | POA: Diagnosis present

## 2019-11-22 DIAGNOSIS — O98813 Other maternal infectious and parasitic diseases complicating pregnancy, third trimester: Secondary | ICD-10-CM | POA: Diagnosis not present

## 2019-11-22 DIAGNOSIS — Z789 Other specified health status: Secondary | ICD-10-CM | POA: Diagnosis present

## 2019-11-22 DIAGNOSIS — Z20822 Contact with and (suspected) exposure to covid-19: Secondary | ICD-10-CM | POA: Diagnosis present

## 2019-11-22 DIAGNOSIS — O09523 Supervision of elderly multigravida, third trimester: Secondary | ICD-10-CM | POA: Diagnosis not present

## 2019-11-22 DIAGNOSIS — O26899 Other specified pregnancy related conditions, unspecified trimester: Secondary | ICD-10-CM

## 2019-11-22 DIAGNOSIS — Z3480 Encounter for supervision of other normal pregnancy, unspecified trimester: Secondary | ICD-10-CM

## 2019-11-22 DIAGNOSIS — Z3A39 39 weeks gestation of pregnancy: Secondary | ICD-10-CM

## 2019-11-22 DIAGNOSIS — O3662X0 Maternal care for excessive fetal growth, second trimester, not applicable or unspecified: Secondary | ICD-10-CM | POA: Diagnosis present

## 2019-11-22 DIAGNOSIS — O99824 Streptococcus B carrier state complicating childbirth: Secondary | ICD-10-CM | POA: Diagnosis present

## 2019-11-22 DIAGNOSIS — N90810 Female genital mutilation status, unspecified: Secondary | ICD-10-CM | POA: Diagnosis present

## 2019-11-22 DIAGNOSIS — B951 Streptococcus, group B, as the cause of diseases classified elsewhere: Secondary | ICD-10-CM

## 2019-11-22 DIAGNOSIS — O99214 Obesity complicating childbirth: Secondary | ICD-10-CM | POA: Diagnosis present

## 2019-11-22 DIAGNOSIS — O26893 Other specified pregnancy related conditions, third trimester: Secondary | ICD-10-CM | POA: Diagnosis present

## 2019-11-22 DIAGNOSIS — R102 Pelvic and perineal pain: Secondary | ICD-10-CM | POA: Diagnosis present

## 2019-11-22 DIAGNOSIS — O26892 Other specified pregnancy related conditions, second trimester: Secondary | ICD-10-CM

## 2019-11-22 DIAGNOSIS — O403XX0 Polyhydramnios, third trimester, not applicable or unspecified: Secondary | ICD-10-CM

## 2019-11-22 LAB — TYPE AND SCREEN
ABO/RH(D): B POS
Antibody Screen: NEGATIVE

## 2019-11-22 LAB — RESPIRATORY PANEL BY RT PCR (FLU A&B, COVID)
Influenza A by PCR: NEGATIVE
Influenza B by PCR: NEGATIVE
SARS Coronavirus 2 by RT PCR: NEGATIVE

## 2019-11-22 LAB — ABO/RH: ABO/RH(D): B POS

## 2019-11-22 LAB — CBC
HCT: 41.5 % (ref 36.0–46.0)
Hemoglobin: 13.3 g/dL (ref 12.0–15.0)
MCH: 27.2 pg (ref 26.0–34.0)
MCHC: 32 g/dL (ref 30.0–36.0)
MCV: 84.9 fL (ref 80.0–100.0)
Platelets: 171 10*3/uL (ref 150–400)
RBC: 4.89 MIL/uL (ref 3.87–5.11)
RDW: 15.1 % (ref 11.5–15.5)
WBC: 7.4 10*3/uL (ref 4.0–10.5)
nRBC: 0 % (ref 0.0–0.2)

## 2019-11-22 MED ORDER — DIPHENHYDRAMINE HCL 25 MG PO CAPS: 25.0000 mg | ORAL_CAPSULE | Freq: Four times a day (QID) | ORAL | Status: DC | PRN

## 2019-11-22 MED ORDER — OXYTOCIN BOLUS FROM INFUSION
500.0000 mL | Freq: Once | INTRAVENOUS | Status: AC
  Administered 2019-11-22: 500 mL via INTRAVENOUS

## 2019-11-22 MED ORDER — SIMETHICONE 80 MG PO CHEW: 80.0000 mg | CHEWABLE_TABLET | ORAL | Status: DC | PRN

## 2019-11-22 MED ORDER — COCONUT OIL OIL: 1.0000 "application " | TOPICAL_OIL | Status: DC | PRN

## 2019-11-22 MED ORDER — METHYLERGONOVINE MALEATE 0.2 MG PO TABS: 0.2000 mg | ORAL_TABLET | ORAL | Status: DC | PRN

## 2019-11-22 MED ORDER — TETANUS-DIPHTH-ACELL PERTUSSIS 5-2.5-18.5 LF-MCG/0.5 IM SUSP: 0.5000 mL | Freq: Once | INTRAMUSCULAR | Status: DC

## 2019-11-22 MED ORDER — ACETAMINOPHEN 325 MG PO TABS
650.0000 mg | ORAL_TABLET | ORAL | Status: DC | PRN
  Administered 2019-11-22: 650 mg via ORAL
  Filled 2019-11-22: qty 2

## 2019-11-22 MED ORDER — SOD CITRATE-CITRIC ACID 500-334 MG/5ML PO SOLN: 30.0000 mL | ORAL | Status: DC | PRN

## 2019-11-22 MED ORDER — OXYTOCIN 40 UNITS IN NORMAL SALINE INFUSION - SIMPLE MED: 2.5000 [IU]/h | INTRAVENOUS | Status: DC | PRN

## 2019-11-22 MED ORDER — WITCH HAZEL-GLYCERIN EX PADS: 1.0000 "application " | MEDICATED_PAD | CUTANEOUS | Status: DC | PRN

## 2019-11-22 MED ORDER — TERBUTALINE SULFATE 1 MG/ML IJ SOLN: 0.2500 mg | Freq: Once | INTRAMUSCULAR | Status: DC | PRN

## 2019-11-22 MED ORDER — OXYTOCIN 40 UNITS IN NORMAL SALINE INFUSION - SIMPLE MED: 1.0000 m[IU]/min | INTRAVENOUS | Status: DC

## 2019-11-22 MED ORDER — ONDANSETRON HCL 4 MG/2ML IJ SOLN: 4.0000 mg | Freq: Four times a day (QID) | INTRAMUSCULAR | Status: DC | PRN

## 2019-11-22 MED ORDER — LACTATED RINGERS IV SOLN: INTRAVENOUS | Status: DC

## 2019-11-22 MED ORDER — LIDOCAINE HCL (PF) 1 % IJ SOLN
30.0000 mL | INTRAMUSCULAR | Status: AC | PRN
  Administered 2019-11-22: 30 mL via SUBCUTANEOUS
  Filled 2019-11-22: qty 30

## 2019-11-22 MED ORDER — DIBUCAINE (PERIANAL) 1 % EX OINT: 1.0000 "application " | TOPICAL_OINTMENT | CUTANEOUS | Status: DC | PRN

## 2019-11-22 MED ORDER — ONDANSETRON HCL 4 MG PO TABS: 4.0000 mg | ORAL_TABLET | ORAL | Status: DC | PRN

## 2019-11-22 MED ORDER — SODIUM CHLORIDE 0.9 % IV SOLN
2.0000 g | Freq: Once | INTRAVENOUS | Status: AC
  Administered 2019-11-22: 2 g via INTRAVENOUS
  Filled 2019-11-22: qty 2000

## 2019-11-22 MED ORDER — MISOPROSTOL 25 MCG QUARTER TABLET: 25.0000 ug | ORAL_TABLET | ORAL | Status: DC | PRN

## 2019-11-22 MED ORDER — ACETAMINOPHEN 325 MG PO TABS: 650.0000 mg | ORAL_TABLET | ORAL | Status: DC | PRN

## 2019-11-22 MED ORDER — IBUPROFEN 600 MG PO TABS
600.0000 mg | ORAL_TABLET | Freq: Four times a day (QID) | ORAL | Status: DC
  Administered 2019-11-22 – 2019-11-24 (×7): 600 mg via ORAL
  Filled 2019-11-22 (×8): qty 1

## 2019-11-22 MED ORDER — PRENATAL MULTIVITAMIN CH
1.0000 | ORAL_TABLET | Freq: Every day | ORAL | Status: DC
  Administered 2019-11-23 – 2019-11-24 (×2): 1 via ORAL
  Filled 2019-11-22 (×2): qty 1

## 2019-11-22 MED ORDER — ONDANSETRON HCL 4 MG/2ML IJ SOLN: 4.0000 mg | INTRAMUSCULAR | Status: DC | PRN

## 2019-11-22 MED ORDER — LACTATED RINGERS IV SOLN: 500.0000 mL | INTRAVENOUS | Status: DC | PRN

## 2019-11-22 MED ORDER — OXYCODONE HCL 5 MG PO TABS: 10.0000 mg | ORAL_TABLET | ORAL | Status: DC | PRN

## 2019-11-22 MED ORDER — METHYLERGONOVINE MALEATE 0.2 MG/ML IJ SOLN: 0.2000 mg | INTRAMUSCULAR | Status: DC | PRN

## 2019-11-22 MED ORDER — BENZOCAINE-MENTHOL 20-0.5 % EX AERO
1.0000 "application " | INHALATION_SPRAY | CUTANEOUS | Status: DC | PRN
  Administered 2019-11-22: 1 via TOPICAL
  Filled 2019-11-22: qty 56

## 2019-11-22 MED ORDER — OXYCODONE HCL 5 MG PO TABS: 5.0000 mg | ORAL_TABLET | ORAL | Status: DC | PRN

## 2019-11-22 MED ORDER — OXYTOCIN 40 UNITS IN NORMAL SALINE INFUSION - SIMPLE MED
2.5000 [IU]/h | INTRAVENOUS | Status: DC
  Filled 2019-11-22: qty 1000

## 2019-11-22 NOTE — MAU Note (Signed)
Pt arrived to MAU via EMS for CTX.  Speaks Somali.  No bleeding or LOF.  Scheduled for induction on Wednesday.

## 2019-11-22 NOTE — Discharge Summary (Addendum)
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 yo [id number] admitted in Active Labor on 11/22/2019. Patient had an uncomplicated labor course as follows:  Membrane Rupture Time/Date: 10:30 AM ,11/22/2019   Intrapartum Procedures: Episiotomy: None [1]                                         Lacerations:  2nd degree [3];Perineal [11]  Patient had a delivery of a Viable infant. 11/22/2019  Information for the patient's newborn:  HuTraniya, Prichett0[[id number]]Delivery Method: Vaginal, Spontaneous(Filed from Delivery Summary)     Pateint had an uncomplicated postpartum course.  She is ambulating, tolerating a regular diet, passing flatus, and urinating well. Patient is discharged home in stable condition on 11/24/19.  Delivery time: 11:38 AM    Magnesium Sulfate received: No BMZ received: No Rhophylac:N/A MMR:N/A Transfusion:No  Physical exam  Vitals:   11/23/19 0603 11/23/19 1427 11/23/19 2115 11/24/19 0550  BP: 119/60  117/74 121/74 108/67  Pulse: 82 79 92 77  Resp: 18 18 18 16   Temp: 98.6 F (37 C) 98.1 F (36.7 C) 98 F (36.7 C) 98.4 F (36.9 C)  TempSrc: Axillary Oral Oral Oral  SpO2:   100% 100%  Weight:      Height:       General: alert, cooperative and no distress Lochia: appropriate Uterine Fundus: firm Incision: N/A DVT Evaluation: No evidence of DVT seen on physical exam. Labs: Lab Results  Component Value Date   WBC 7.4 11/22/2019   HGB 13.3 11/22/2019   HCT 41.5 11/22/2019   MCV 84.9 11/22/2019   PLT 171 11/22/2019   CMP Latest Ref Rng & Units 10/20/2017  Glucose 65 - 99 mg/dL 83  BUN 6 - 20 mg/dL 11  Creatinine 0.44 - 1.00 mg/dL 0.62  Sodium 135 - 145 mmol/L 133(L)  Potassium 3.5 - 5.1 mmol/L 4.1  Chloride 101 - 111 mmol/L 106  CO2 22 - 32 mmol/L 19(L)  Calcium 8.9 - 10.3 mg/dL  8.6(L)  Total Protein 6.5 - 8.1 g/dL 7.1  Total Bilirubin 0.3 - 1.2 mg/dL 0.7  Alkaline Phos 38 - 126 U/L 190(H)  AST 15 - 41 U/L 22  ALT 14 - 54 U/L 14   Edinburgh Score: Edinburgh Postnatal Depression Scale Screening Tool 11/23/2019  I have been able to laugh and see the funny side of things. 0  I have looked forward with enjoyment to things. 0  I have blamed myself unnecessarily when things went wrong. 1  I have been anxious or worried for no good reason. 0  I have felt scared or panicky for no good reason. 0  Things have been getting on top of me. 0  I have been so unhappy that I have had difficulty sleeping. 0  I have felt sad or miserable. 0  I have been so unhappy that I have been crying. 0  The thought of harming myself has occurred to me. 0  Edinburgh Postnatal Depression Scale Total 1    Discharge instruction: per After Visit Summary and "Baby and Me Booklet".  After visit meds:  Allergies as of 11/24/2019   No Known Allergies     Medication List    STOP taking these medications   Blood Pressure Kit Devi   famotidine 20 MG tablet Commonly known as: Pepcid    pantoprazole 40 MG tablet Commonly known as: Protonix   traMADol 50 MG tablet Commonly known as: ULTRAM     TAKE these medications   acetaminophen 325 MG tablet Commonly known as: Tylenol Take 2 tablets (650 mg total) by mouth every 4 (four) hours as needed for mild pain (for pain scale < 4). What changed:   medication strength  how much to take  when to take this  reasons to take this   ibuprofen 600 MG tablet Commonly known as: ADVIL Take 1 tablet (600 mg total) by mouth every 6 (six) hours.   multivitamin-prenatal 27-0.8 MG Tabs tablet Take 1 tablet by mouth daily at 12 noon.       Diet: routine diet  Activity: Advance as tolerated. Pelvic rest for 6 weeks.   Outpatient follow up:4 weeks Follow up Appt: Future Appointments  Date Time Provider [place]  12/27/2019  2:00 PM Constant, Peggy, MD [place] None   Follow up Visit: Please schedule this patient for Postpartum visit in: 6 weeks with the following provider: Any provider Virtual For C/S patients schedule nurse incision check in weeks 2 weeks: no Low risk pregnancy complicated by: none Delivery mode:  SVD Anticipated Birth Control:  none PP Procedures needed: none  Schedule Integrated BH visit: no  Newborn Data: Live born female  Birth Weight: 5 lb 15.4 oz (2705 g) APGAR: 8, 9  Newborn Delivery   Birth date/time: 11/22/2019 11:38:00 Delivery type: Vaginal, Spontaneous      Baby Feeding: Breast Disposition:home with mother   11/24/2019 Lurline Del, DO  Attestation of Supervision of Student:  I confirm that I have verified the information documented in the resident's  student's note and that I have also personally reperformed the history, physical exam and all medical decision making activities.  I have verified that all services and findings are accurately documented in this student's note; and I agree with management and plan as outlined in the documentation. I have also made any  necessary editorial changes.  Marcille Buffy DNP, CNM  11/24/19  11:16 AM

## 2019-11-22 NOTE — H&P (Addendum)
OBSTETRIC ADMISSION HISTORY AND PHYSICAL  Tonya [name] is a 34 y.o. female [phone] with IUP at 59w3dby 6 wk [place]ore[place] for SOL. She reports +FMs, LOF in MAU, no VB, no blurry vision, headaches or peripheral edema, and RUQ pain.  She plans on breast feeding. She declines birth control. [place] [facility] Dating: [place] 6 week [place]--->  Estimated Date of Delivery: 11/26/19  Sono:    _0 , CWD, normal anatomy, cephalic presentation, anterior placenta, 3438g, 74% EFW 7'9  Prenatal History/Complications: Language barrier GBS (+) Polyhydramnios, resolved Placental lakes Advanced maternal age [place]ultiparity Obesity  Past Medical History: Past Medical History:  Diagnosis Date  . Medical history non-contributory     Past Surgical History: Past Surgical History:  Procedure Laterality Date  . NO PAST SURGERIES      Obstetrical History: OB History    Gravida  6   Para  5   Term  5   Preterm      AB      Living  5     SAB      TAB      Ectopic      Multiple  0   Live Births  5           Social History Social History   Socioeconomic History  . Marital status: Married    Spouse name: Not on file  . Number of children: 5  . Years of education: Not on file  . Highest education level: Not on file  Occupational History  . Occupation: Unemployed  Tobacco Use  . Smoking status: Never Smoker  . Smokeless tobacco: Never Used  Substance and Sexual Activity  . Alcohol use: No  . Drug use: No  . Sexual activity: Yes    Birth control/protection: None  Other Topics Concern  . Not on file  Social History Narrative  . Not on file   Social Determinants of Health   Financial Resource Strain:   . Difficulty of Paying Living Expenses:   Food Insecurity:   . Worried About RCharity fundraiserin the Last Year:   . RArboriculturistin the Last Year:   Transportation Needs:   . LFilm/video editor(Medical):   .Marland KitchenLack of  Transportation (Non-Medical):   Physical Activity:   . Days of Exercise per Week:   . Minutes of Exercise per Session:   Stress:   . Feeling of Stress :   Social Connections:   . Frequency of Communication with Friends and Family:   . Frequency of Social Gatherings with Friends and Family:   . Attends Religious Services:   . Active Member of Clubs or Organizations:   . Attends CArchivistMeetings:   .Marland KitchenMarital Status:     Family History: Family History  Problem Relation Age of Onset  . Cancer Mother   . Alcohol abuse Neg Hx   . Arthritis Neg Hx   . Asthma Neg Hx   . Birth defects Neg Hx   . COPD Neg Hx   . Depression Neg Hx   . Diabetes Neg Hx   . Drug abuse Neg Hx   . Early death Neg Hx   . Hearing loss Neg Hx   . Heart disease Neg Hx   . Hyperlipidemia Neg Hx   . Hypertension Neg Hx   . Kidney disease Neg Hx   . Learning disabilities Neg Hx   .  Mental illness Neg Hx   . Mental retardation Neg Hx   . Miscarriages / Stillbirths Neg Hx   . Stroke Neg Hx   . Vision loss Neg Hx   . Varicose Veins Neg Hx     Allergies: No Known Allergies  Medications Prior to Admission  Medication Sig Dispense Refill Last Dose  . acetaminophen (TYLENOL) 500 MG tablet Take 1,000 mg by mouth every 6 (six) hours as needed for moderate pain or headache.     . Blood Pressure Monitoring (BLOOD PRESSURE KIT) DEVI 1 kit by Does not apply route once a week. Check BP regularly and record readings in the Babyscripts App.  Large Cuff.  DX:O90.0 1 kit 0   . famotidine (PEPCID) 20 MG tablet Take 1 tablet (20 mg total) by mouth 2 (two) times daily as needed for heartburn or indigestion. (Patient not taking: Reported on 09/30/2019) 60 tablet 2   . pantoprazole (PROTONIX) 40 MG tablet Take 1 tablet (40 mg total) by mouth daily. 30 tablet 5   . Prenatal Vit-Fe Fumarate-FA (MULTIVITAMIN-PRENATAL) 27-0.8 MG TABS tablet Take 1 tablet by mouth daily at 12 noon.     . traMADol (ULTRAM) 50 MG tablet  Take 1 tablet (50 mg total) by mouth every 6 (six) hours as needed for moderate pain. (Patient not taking: Reported on 07/06/2019) 10 tablet 0      Review of Systems   All systems reviewed and negative except as stated in HPI  Last menstrual period 03/06/2019, unknown if currently breastfeeding. General appearance: alert, cooperative and no distress Lungs: normal respiratory effort Abdomen: gravid Pelvic: 7.5/80/-1 Extremities: Homans sign is negative, no sign of DVT Presentation: cephalic Fetal monitoringBaseline: 125 bpm, Variability: Good {> 6 bpm), Accelerations: Reactive and Decelerations: Variable:   Uterine activityDate/time of onset: 0600   Prenatal labs: ABO, Rh: B/Positive/-- (11/10 1657) Antibody: Negative (11/10 1657) Rubella: 1.50 (11/10 1657) RPR: Non Reactive (01/07 0930)  HBsAg: Negative (11/10 1657)  HIV: Non Reactive (01/07 0930)  GBS: Positive/-- (03/11 0343)  2 hr Glucola Normal Genetic screening  Normal Anatomy US Normal except EFW 99%tile and placental lakes, suspected LGA  Prenatal Transfer Tool  Maternal Diabetes: No Genetic Screening: Normal Maternal Ultrasounds/Referrals: Other: polyhydramnios, resolved Fetal Ultrasounds or other Referrals:  Referred to Materal Fetal Medicine  Maternal Substance Abuse:  None reported Significant Maternal Medications:  Meds include: Protonix Significant Maternal Lab Results: Group B Strep positive  No results found for this or any previous visit (from the past 24 hour(s)).  Patient Active Problem List   Diagnosis Date Noted  . Positive GBS test 11/08/2019  . Polyhydramnios affecting pregnancy in third trimester 09/15/2019  . Excessive fetal growth affecting management of mother in second trimester, antepartum 08/31/2019  . Heartburn during pregnancy in second trimester 08/03/2019  . Supervision of other normal pregnancy, antepartum 07/05/2019  . 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 Pelvic pain  affecting pregnancy 04/07/2019  . Language barrier 10/16/2015    Assessment/Plan:  Tonya [name] is a 34 y.o. [id number] at 33w3dhere for SOL.  #Labor: Active labor, SROM #Pain: declines #FWB: Category 2 #ID:  GBS positive #MOF: Breast #MOC: None #Circ:  Yes  ABruna Potter Medical Student  11/22/2019, 10:56 AM  Midwife attestation: I have seen and examined this patient; I agree with above documentation in the student's note.   PE: Gen: calm comfortable, NAD Resp: normal effort and rate Abd: gravid  ROS, labs, PMH reviewed  Assessment/Plan: Tonya [name] is  a 34 y.o. [id number] here for active labor Admit to LD Labor: active FWB: Cat II GBS pos>Amp Prepare for precipitous SVD  Julianne Handler, CNM  11/22/2019, 12:23 PM

## 2019-11-22 NOTE — Lactation Note (Signed)
This note was copied from a baby's chart. Lactation Consultation Note  Patient Name: Tonya [name] Date: 11/22/2019 Reason for consult: Initial assessment;Term  P6 mother whose infant is now 105 hours old.  This is a term baby at 39+3 weeks.  Mother has breast fed all her other children (ages range from 36 years old- 29 years old) for 1 1/2 years each.    Mother speaks Somali; she has a signed consent for a personal interpreter to be used for all interpretation.  This consent is in mother's chart.  Baby was in the nursery under the radiant warmer when I arrived.  Mother had no questions/concerns related to breast feeding.  She plans to breast feed when he returns from the nursery.  Encouraged to feed 8-12 times/24 hours or sooner if baby shows feeding cues.  Reviewed feeding cues.  Taught hand expression and colostrum container provided.  Milk storage times discussed and finger feeding demonstrated.  Mother was able to express colostrum drops.  She will call for latch assistance as needed.  Mother has a DEBP for home use but does not really use it much at all.     Maternal Data Formula Feeding for Exclusion: Yes Reason for exclusion: Mother's choice to formula and breast feed on admission Has patient been taught Hand Expression?: Yes Does the patient have breastfeeding experience prior to this delivery?: Yes  Feeding    LATCH Score                   Interventions    Lactation Tools Discussed/Used     Consult Status Consult Status: Follow-up Date: 11/23/19 Follow-up type: In-patient    Dora Sims 11/22/2019, 3:37 PM

## 2019-11-22 NOTE — Progress Notes (Addendum)
Dr Raynald Kemp notified of pt's arrival and VE, states he will put orders in to admit pt

## 2019-11-23 ENCOUNTER — Other Ambulatory Visit (HOSPITAL_COMMUNITY): Payer: Medicaid Other

## 2019-11-23 LAB — RPR: RPR Ser Ql: NONREACTIVE

## 2019-11-23 NOTE — Progress Notes (Addendum)
Post Partum Day 1 after vaginal delivery Subjective: no complaints. Patient states she is feeling well today, denies any pelvic and vaginal pain. States she has a small amount of vaginal bleeding. Patient is able to ambulate, eat, drink, and void without problems. Patient denies discussion of birth control options at this time. Plans to breast feed.    Objective: Blood pressure 119/60, pulse 82, temperature 98.6 F (37 C), temperature source Axillary, resp. rate 18, height 5\' 2"  (1.575 m), weight 94.3 kg, last menstrual period 03/06/2019, SpO2 99 %, unknown if currently breastfeeding.  Physical Exam:  General: alert Lochia: appropriate DVT Evaluation: No evidence of DVT seen on physical exam.  Recent Labs    11/22/19 1059  HGB 13.3  HCT 41.5    Assessment/Plan: Discharge home today after pediatrician clears the baby for discharge.   LOS: 1 day   11/24/19 11/23/2019, 7:24 AM   I saw and evaluated the patient. I agree with the findings and the plan of care as documented in the student's note. Breastfeeding. Vitals stable. Declines birth control. Desires discharge. Okay to discharge today if baby can; RN to page team for orders. Audio Swahili interpreter utilized.   11/25/2019, MD [facility] Family Medicine Fellow, [facility] for [facility]

## 2019-11-23 NOTE — Lactation Note (Signed)
This note was copied from a baby's chart. Lactation Consultation Note  Patient Name: Tonya [name] [id number] Date: 11/23/2019 Reason for consult: Follow-up assessment;Term Baby is 26 hours old/3% weight loss.  Private interpreter present.  Mom reports that breastfeeding is going well.  She denies questions or concerns.  Encouraged to call for assist prn.  Maternal Data    Feeding    LATCH Score                   Interventions    Lactation Tools Discussed/Used     Consult Status Consult Status: Follow-up Date: 11/24/19 Follow-up type: In-patient    Huston Foley 11/23/2019, 1:38 PM

## 2019-11-24 ENCOUNTER — Other Ambulatory Visit (HOSPITAL_COMMUNITY): Payer: Medicaid Other

## 2019-11-24 MED ORDER — ACETAMINOPHEN 325 MG PO TABS: 650.0000 mg | ORAL_TABLET | ORAL | 0 refills | Status: AC | PRN

## 2019-11-24 MED ORDER — IBUPROFEN 600 MG PO TABS: 600.0000 mg | ORAL_TABLET | Freq: Four times a day (QID) | ORAL | 0 refills | Status: DC

## 2019-11-24 NOTE — Lactation Note (Signed)
This note was copied from a baby's chart. Lactation Consultation Note  Patient Name: Tonya [name] [id number] Date: 11/24/2019 Reason for consult: Follow-up assessment;Term;Infant < 6lbs;Infant weight loss  Used [employer] Interpreter services for [place] language, interpreter Karie Mainland # [phone]  Visited with mom of a 55 hours old FT female who is being mostly BF by his mother, she's a P6 and experienced BF. However this baby is < 6 lbs, explained to mom how she may need to pump after feedings in case baby is not able to fully empty the breast and to prevent engorgement.   Mom doesn't have a pump at home, [facility] offered one from the hospital, instructions, cleaning and storage were reviewed. Baby is at 4% weight loss, mom and baby are going home today. Reviewed discharge instructions, engorgement prevention and treatment, treat/prevention for sore nipples.  Per mom and RN Asher Muir BF is going well, baby just had a 40 minutes BF session prior entering the room, mom also told LC that baby didn't like formula but her breastmilk instead, praised her for her efforts. Encouraged nursing baby 8-12 times/24 hours and to pump after feedings, and to offer any amount of EBM she may get. Mom reported all questions and concerns were answered, she's aware of LC OP services and will contact PRN.  Maternal Data    Feeding Feeding Type: Breast Fed  LATCH Score Latch: Grasps breast easily, tongue down, lips flanged, rhythmical sucking.  Audible Swallowing: Spontaneous and intermittent  Type of Nipple: Everted at rest and after stimulation  Comfort (Breast/Nipple): Filling, red/small blisters or bruises, mild/mod discomfort  Hold (Positioning): No assistance needed to correctly position infant at breast.  LATCH Score: 9  Interventions Interventions: Breast feeding basics reviewed;Hand pump  Lactation Tools Discussed/Used Tools: Pump Breast pump type: Manual Pump Review: Setup, frequency, and cleaning Initiated by::  MPeck Date initiated:: 11/24/19   Consult Status Consult Status: Complete Date: 11/24/19 Follow-up type: Call as needed    Kerby Borner Venetia Constable 11/24/2019, 11:52 AM

## 2019-11-24 NOTE — Discharge Instructions (Signed)

## 2019-11-25 ENCOUNTER — Inpatient Hospital Stay (HOSPITAL_COMMUNITY)
Admission: AD | Admit: 2019-11-25 | Payer: Medicaid Other | Source: Home / Self Care | Admitting: Obstetrics & Gynecology

## 2019-11-25 ENCOUNTER — Inpatient Hospital Stay (HOSPITAL_COMMUNITY): Payer: Medicaid Other

## 2019-11-25 ENCOUNTER — Encounter: Payer: Medicaid Other | Admitting: Nurse Practitioner

## 2019-12-27 ENCOUNTER — Telehealth (INDEPENDENT_AMBULATORY_CARE_PROVIDER_SITE_OTHER): Payer: Medicaid Other | Admitting: Obstetrics and Gynecology

## 2019-12-27 NOTE — Progress Notes (Signed)
Patient not available for virtual appointment as she is caring for her family. PP visit will be rescheduled

## 2019-12-30 ENCOUNTER — Ambulatory Visit (INDEPENDENT_AMBULATORY_CARE_PROVIDER_SITE_OTHER): Payer: Medicaid Other | Admitting: Nurse Practitioner

## 2019-12-30 ENCOUNTER — Encounter: Payer: Self-pay | Admitting: Nurse Practitioner

## 2019-12-30 NOTE — Progress Notes (Addendum)
      I connected with@ on 12/30/19 at  2:20 PM EDT by: MyChart and verified that I am speaking with the correct person using two identifiers.  Patient is located at home and provider is located at [employer] for [employer] at [place].     The purpose of this virtual visit is to provide medical care while limiting exposure to the novel coronavirus. I discussed the limitations, risks, security and privacy concerns of performing an evaluation and management service by [facility] at [facility] and the availability of in person appointments. I also discussed with the patient that there may be a patient responsible charge related to this service. By engaging in this virtual visit, you consent to the provision of healthcare.  Additionally, you authorize for your insurance to be billed for the services provided during this visit.  The patient expressed understanding and agreed to proceed.  The following staff members participated in the virtual visit: Tyvona CMA, Somali translator [phone] and Nolene Bernheim, NP  Post Partum Visit Note Subjective:   Jianni Babilonia is a 34 y.o. [phone] female being evaluated for postpartum followup.  She is 6 weeks postpartum following a normal spontaneous vaginal delivery at [redacted]w[redacted]d gestational weeks. I have fully reviewed the prenatal and intrapartum course; pregnancy complicated by unremarkable.  Postpartum course has been unremarkable. Baby is doing well. Baby is feeding by breast. Bleeding no bleeding. Bowel function is normal. Bladder function is normal. Patient is not sexually active. Contraception method is none. Postpartum depression screening: negative. EPDS = 1  The following portions of the patient's history were reviewed and updated as appropriate: allergies, past family history, past medical history, past social history, past surgical history and problem list.  Review of Systems Pertinent items noted in HPI and remainder of comprehensive ROS otherwise negative.    Objective:  There were no vitals filed for this visit. Self-Obtained       Assessment:    normal virtual postpartum exam.  Does not want contraception.  Somali interpreter present for the entire visit.  Plan:  Essential components of care per ACOG recommendations:  1.  Mood and well being: Patient with negative depression screening today. Reviewed local resources for support.  - Patient does not use tobacco. - hx of drug use? No   2. Infant care and feeding:  -Patient currently breastmilk feeding? Yes   3. Sexuality, contraception and birth spacing  Client unable to discuss at the time of the visit.  Could hear additional sounds from her audio feed - likely she was not alone at the time of the visit.  4. Sleep and fatigue Reports she is doing well with all her activities at home and has no questions.  5. Physical Recovery  - Discussed patients delivery spontaneous vaginal birth and no pain currently from urination or wiping - assume 2nd degree laceration is healed.  6.  Health Maintenance - Last pap smear done 07-08-19 and was normal with negative HPV.  6 minutes of non-face-to-face time spent with the patient in a video visit with the patient and an interpreter.   Dalphine Handing, CMA [facility] [employer], [facility]  Nolene Bernheim NP [facility] [employer], [facility]

## 2020-03-01 IMAGING — US US MFM OB FOLLOW-UP
1 series · 13 of 28 positions shown · non-contrast
Comparison: none

[Series 1: us mfm ob follow-up · 35 acquisitions, 13 frames shown]
[im 2/35]
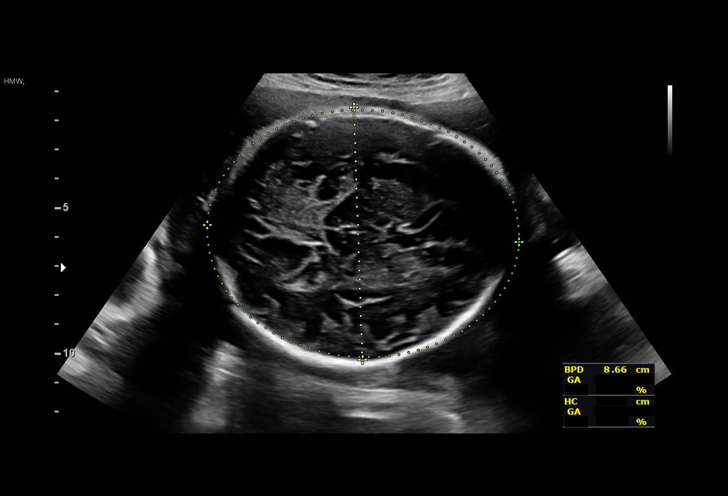
[im 4/35]
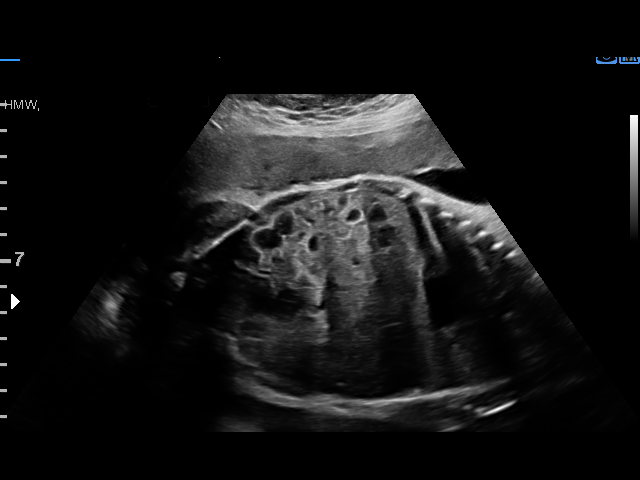
[im 7/35]
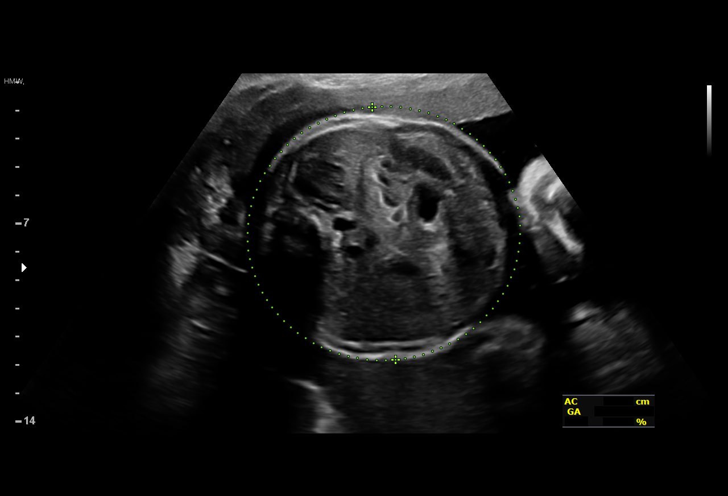
[im 9/35]
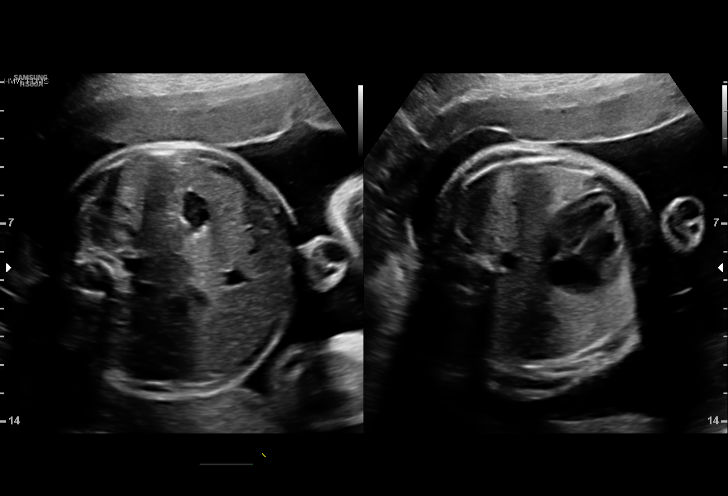
[im 12/35]
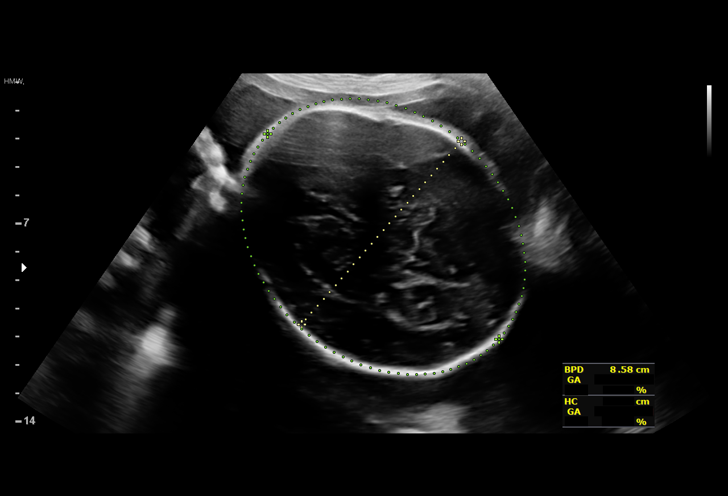
[im 14/35]
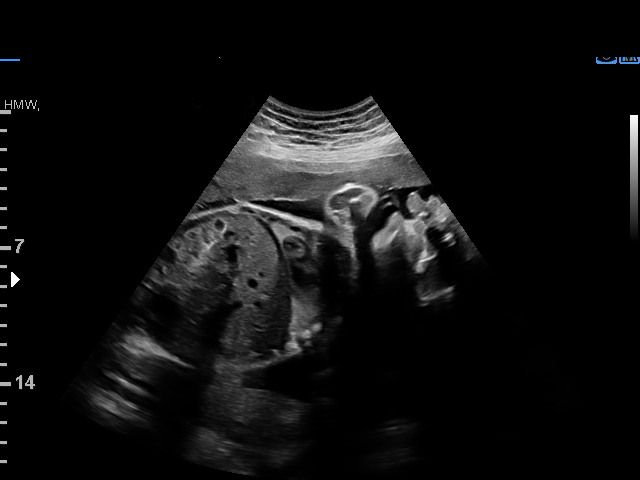
[im 18/35]
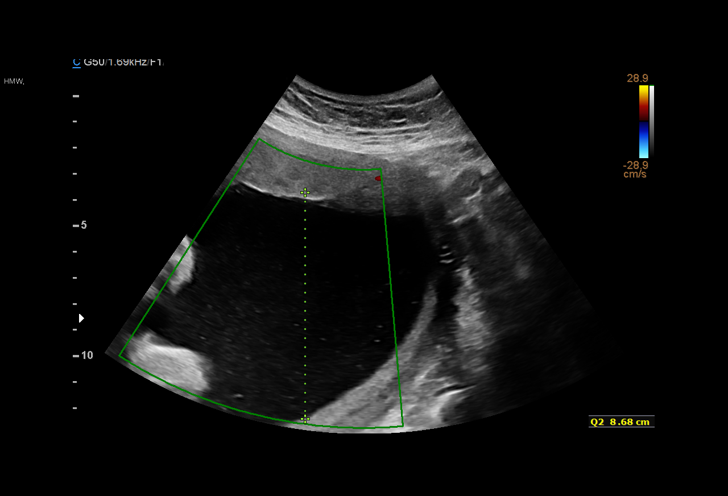
[im 21/35]
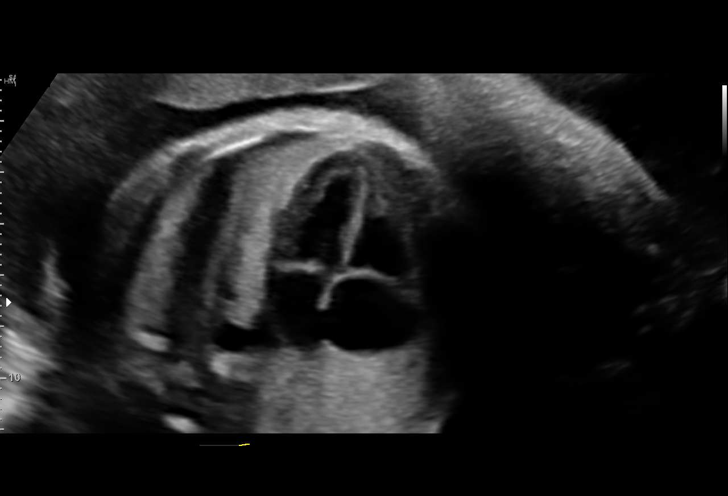
[im 23/35]
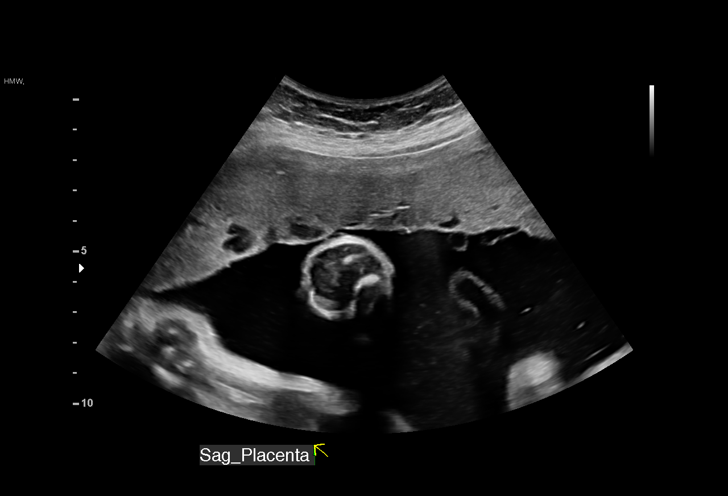
[im 26/35]
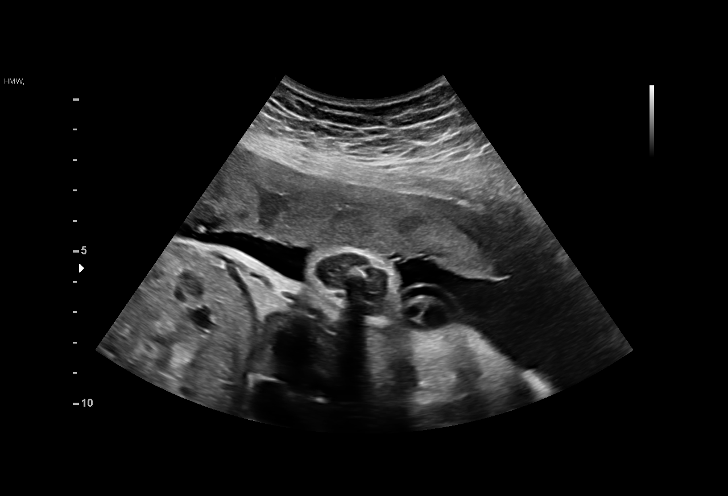
[im 28/35]
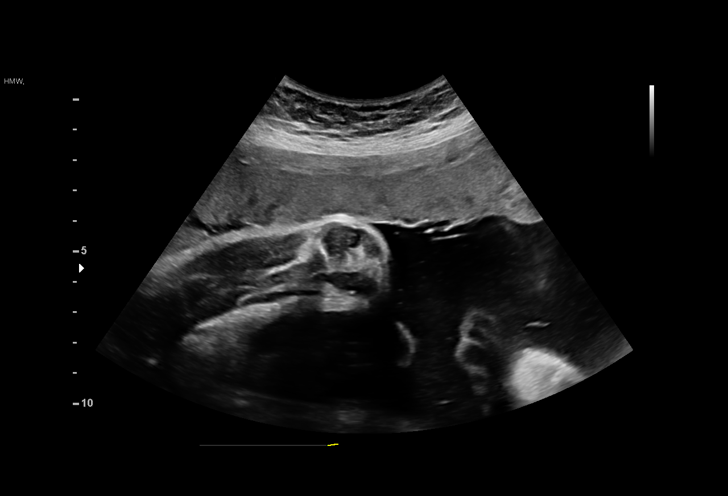
[im 31/35]
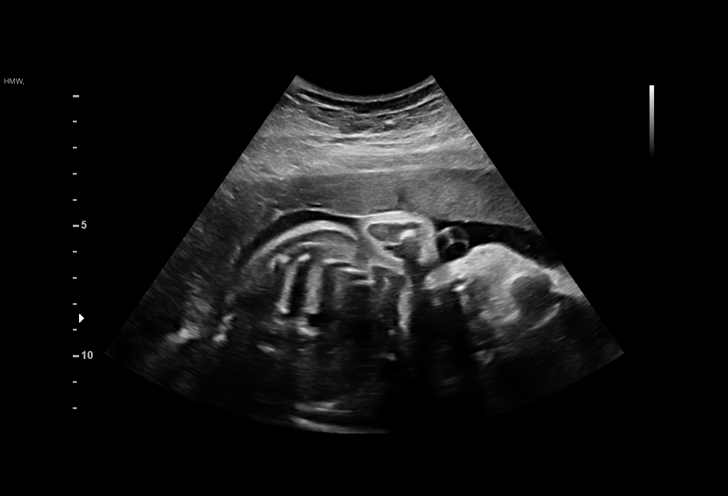
[im 33/35]
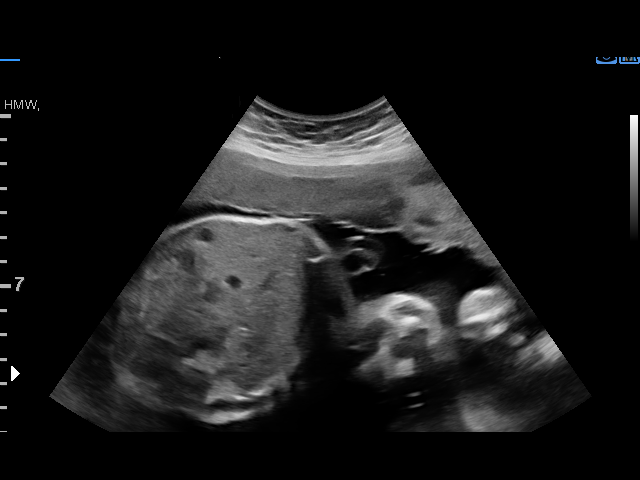

[13 of 28 positions shown; findings below may reference images not displayed]

----------------------------------------------------------------------

 ----------------------------------------------------------------------
Indications

  31 weeks gestation of pregnancy
  Polyhydramnios, third trimester, antepartum
  condition or complication, unspecified fetus
  Advanced maternal age multigravida 35+,
  third trimester
  Grand multiparity, antepartum
  Encounter for other antenatal screening
  follow-up
  Obesity complicating pregnancy, third
  trimester
 ----------------------------------------------------------------------
Vital Signs

                                                Height:        5'3"
Fetal Evaluation

 Num Of Fetuses:          1
 Fetal Heart Rate(bpm):   136
 Cardiac Activity:        Observed
 Presentation:            Cephalic
 Placenta:                Anterior
 P. Cord Insertion:       Visualized, central

 Amniotic Fluid
 AFI FV:      Polyhydramnios

 AFI Sum(cm)     %Tile       Largest Pocket(cm)
 30.09           > 97

 RUQ(cm)       RLQ(cm)       LUQ(cm)        LLQ(cm)

Biophysical Evaluation

 Amniotic F.V:   Pocket => 2 cm             F. Tone:         Observed
 F. Movement:    Observed                   Score:           [DATE]
 F. Breathing:   Observed
Biometry

 BPD:      86.2  mm     G. Age:  34w 5d         98  %    CI:        77.48   %    70 - 86
                                                         FL/HC:       21.3  %    19.1 -
 HC:       310   mm     G. Age:  34w 4d         85  %    HC/AC:       1.06       0.96 -
 AC:      291.5  mm     G. Age:  33w 1d         83  %    FL/BPD:      76.6  %    71 - 87
 FL:         66  mm     G. Age:  34w 0d         88  %    FL/AC:       22.6  %    20 - 24

 Est. FW:    6616   gm          5 lb     91  %
OB History

 Gravidity:    6         Term:   5        Prem:   0        SAB:   0
 TOP:          0       Ectopic:  0        Living: 5
Gestational Age

 LMP:           29w 5d        Date:  03/06/19                 EDD:   12/11/19
 U/S Today:     34w 1d                                        EDD:   11/10/19
 Best:          31w 6d     Det. By:  Early Ultrasound         EDD:   11/26/19
                                     (04/07/19)
Anatomy

 Cranium:               Appears normal         Aortic Arch:            Previously seen
 Cavum:                 Previously seen        Ductal Arch:            Previously seen
 Ventricles:            Previously seen        Diaphragm:              Previously seen
 Choroid Plexus:        Previously seen        Stomach:                Appears normal, left
                                                                       sided
 Cerebellum:            Previously seen        Abdomen:                Appears normal
 Posterior Fossa:       Previously seen        Abdominal Wall:         Previously seen
 Nuchal Fold:           Previously seen        Cord Vessels:           Previously seen
 Face:                  Orbits and profile     Kidneys:                Appear normal
                        previously seen
 Lips:                  Previously seen        Bladder:                Appears normal
 Thoracic:              Appears normal         Spine:                  Previously seen
 Heart:                 Appears normal         Upper Extremities:      Previously seen
                        (4CH, axis, and
                        situs)
 RVOT:                  Previously seen        Lower Extremities:      Previously seen
 LVOT:                  Previously seen

 Other:  Fetus appears to be a male. 3VV, 3VTV, and Heels previously
         visualized.
Impression

 Patient with polyhydramnios detected on previous scan return
 gestational diabetes.
 On today's ultrasound, severe in the absence of is seen
 again (AFI= 30 cm).  Cephalic presentation.  The estimated
 fetal weight is at the 91st percentile.  Incidentally observed
 antenatal testing is reassuring.  Fetal stomach and kidneys
 appear normal.
 Placenta has multiple small lacunae.  However myometrial
 placental interface appears normal.  There is no evidence of
 previa the bladder wall is intact.  I do not suspect placenta
 accreta.

 I counseled the patient with the help of Somali language
 interpreter ([employer]).  I explained that polyhydramnios in the
 absence of fetal anomalies and gestational diabetes is
 usually idiopathic (no known cause).  However, ultrasound
 has limitations in detecting fetal anomalies that may be
 evident only postnatal life.  I explained that polyhydramnios
 can lead to preterm labor.
Recommendations

 -BPP in 2 weeks and then weekly till delivery or when
 polyhydramnios resolves.
                 Lopin, Ante Legenda

## 2020-03-19 IMAGING — US US MFM FETAL BPP W/O NON-STRESS
1 series · 12 of 16 positions shown · non-contrast
Comparison: none

[Series 1: us mfm fetal bpp w/o non-stress · 16 acquisitions, 12 frames shown]
[im 1/16]
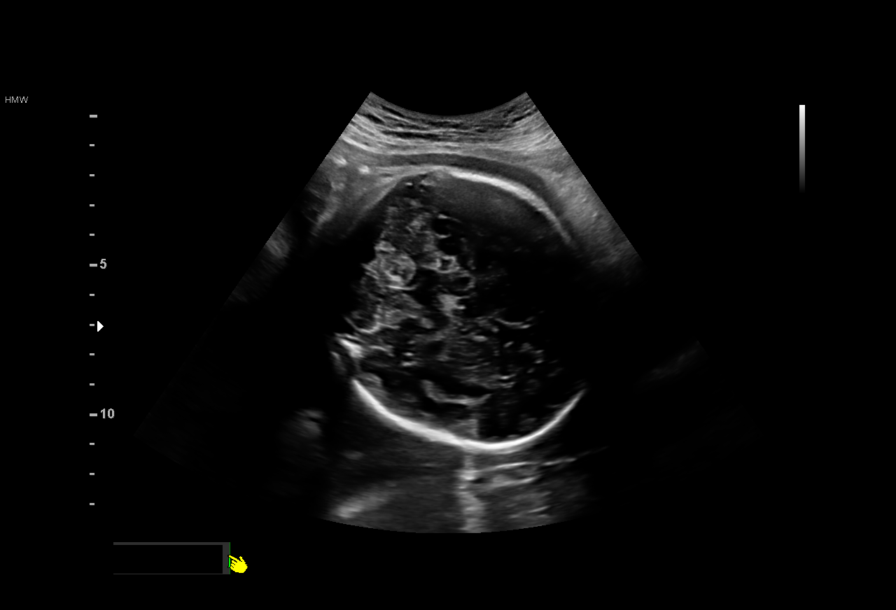
[im 3/16]
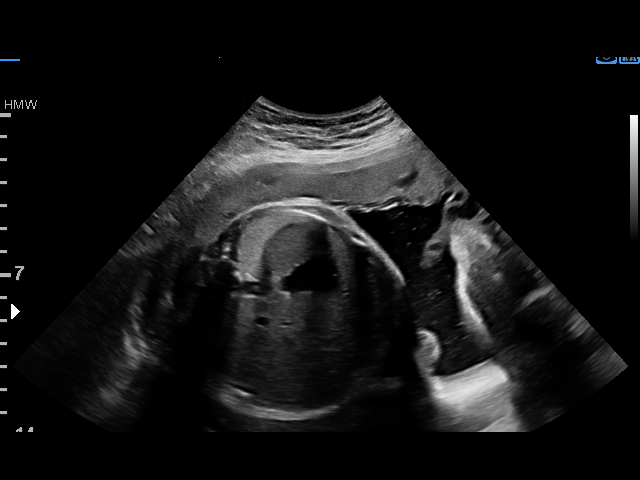
[im 4/16]
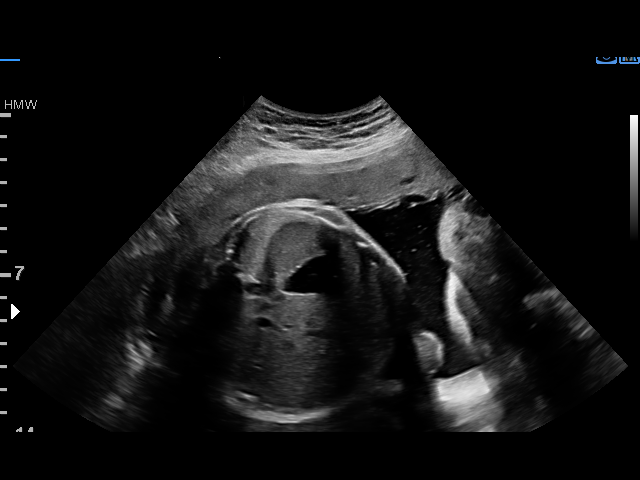
[im 5/16]
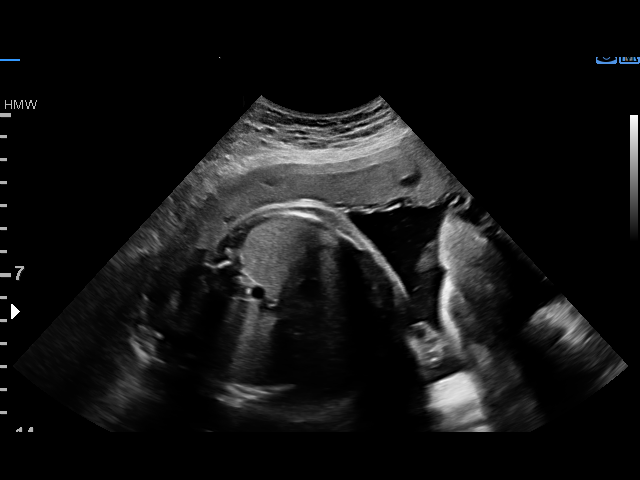
[im 7/16]
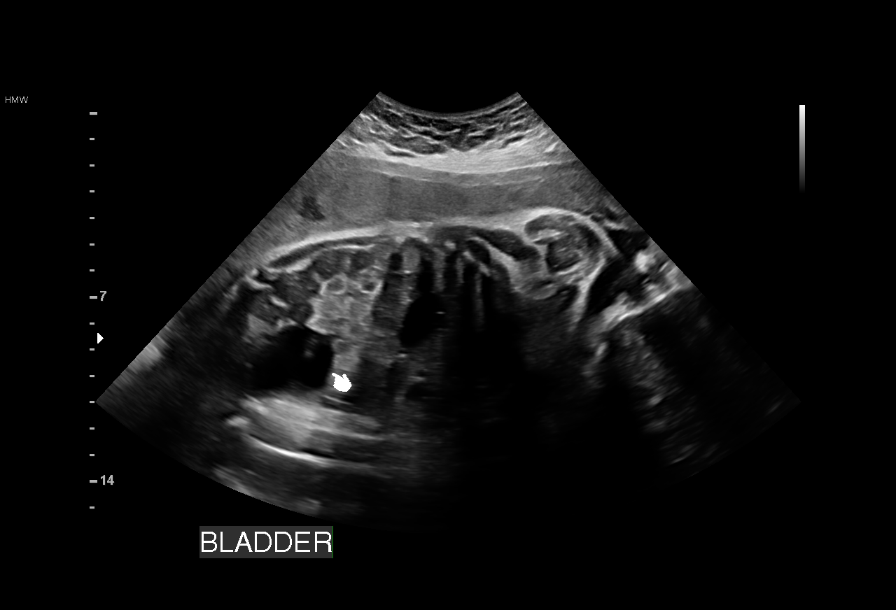
[im 8/16]
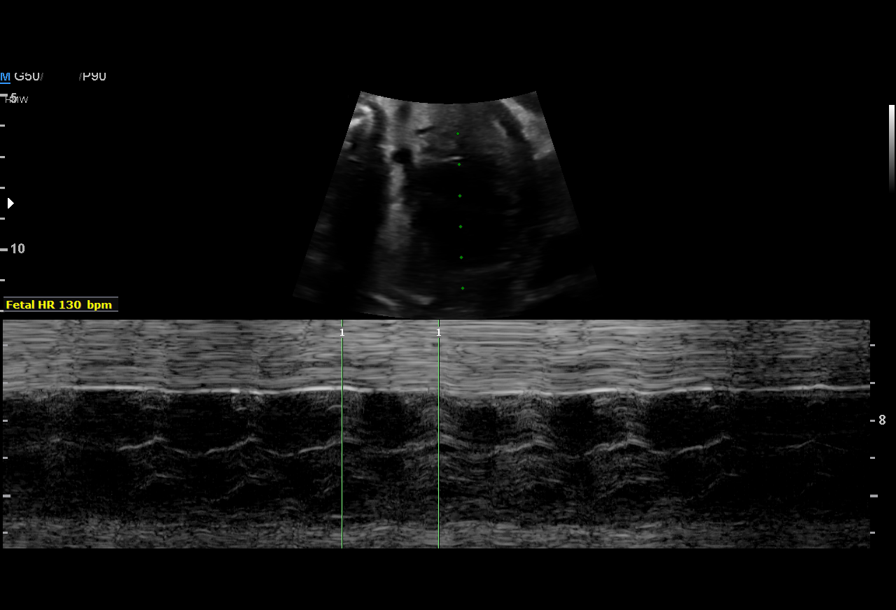
[im 9/16]
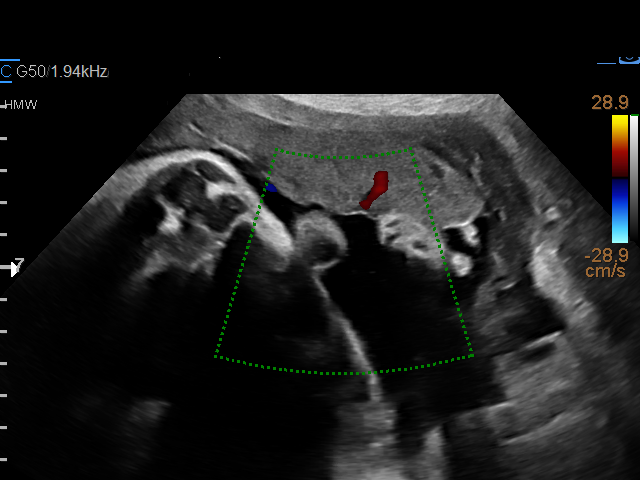
[im 11/16]
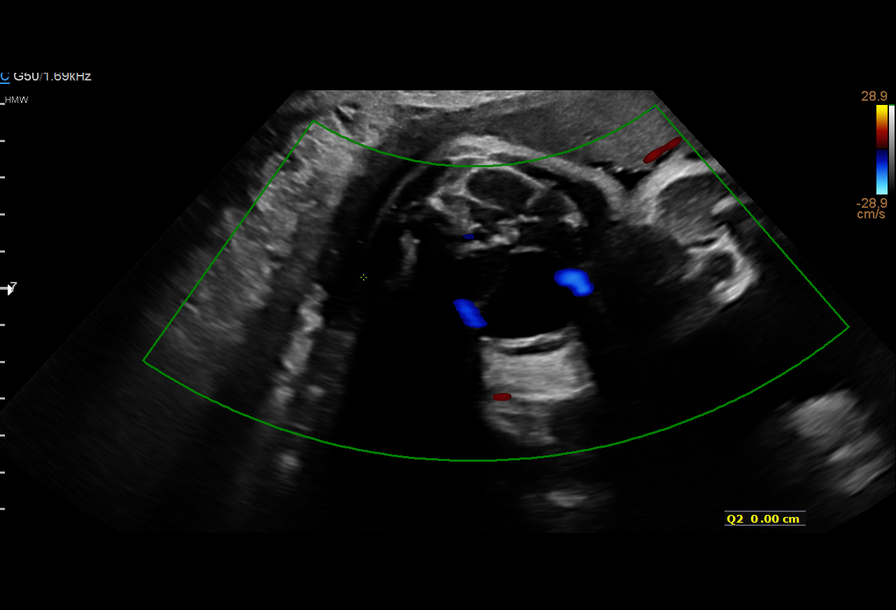
[im 12/16]
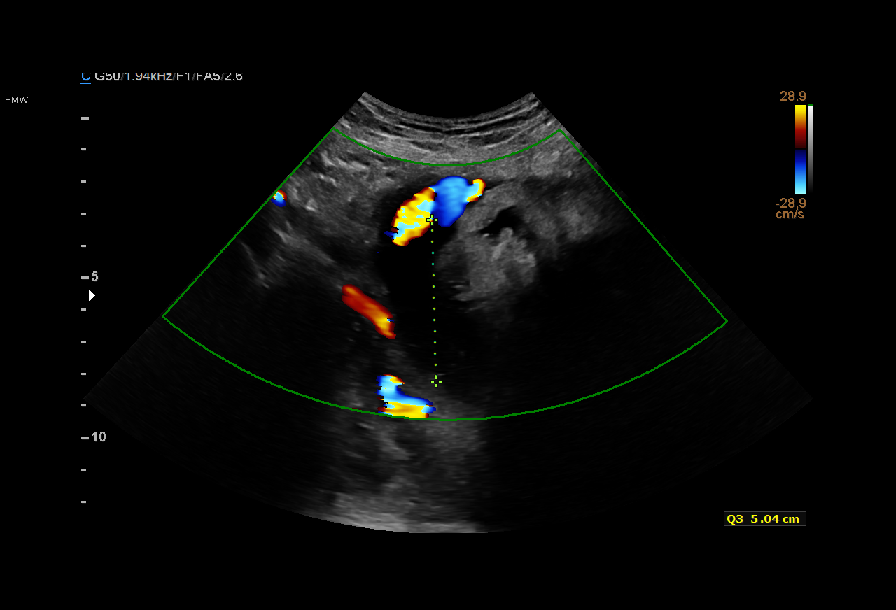
[im 13/16]
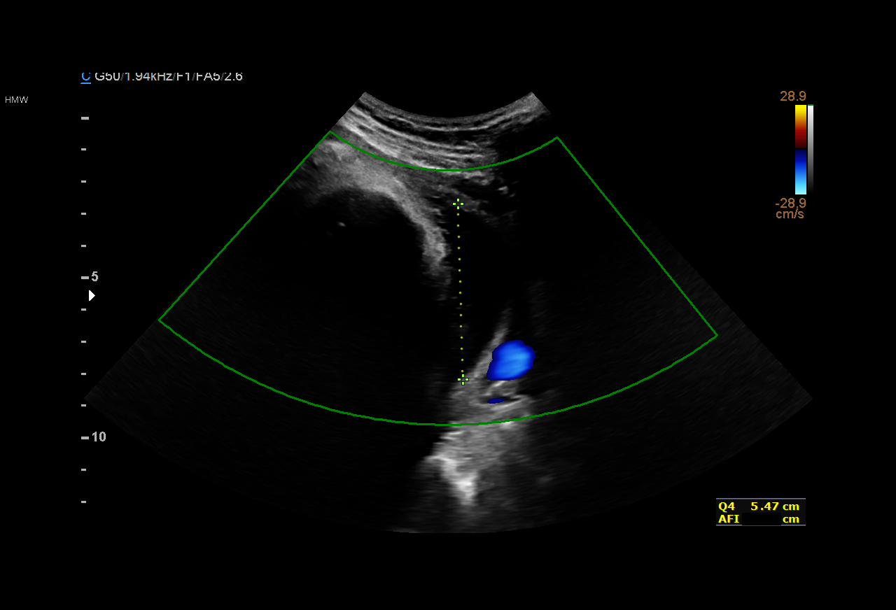
[im 15/16]
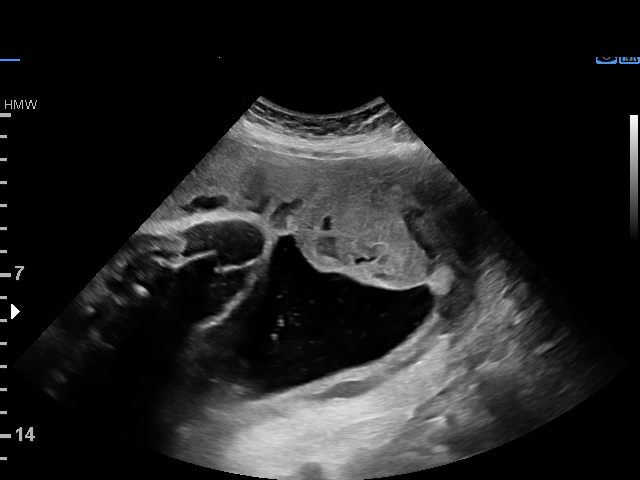
[im 16/16]
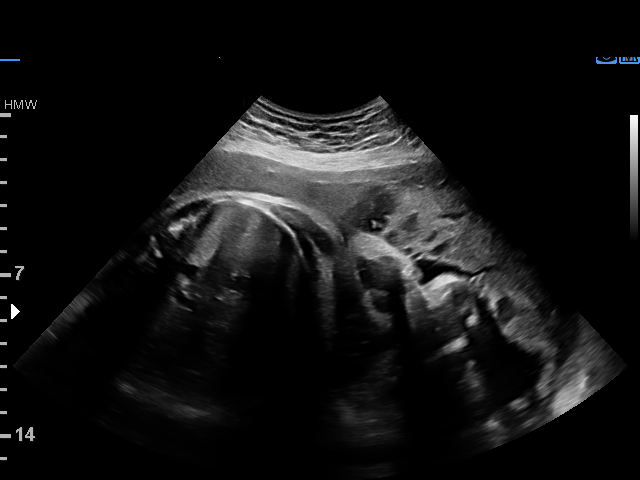

[12 of 16 positions shown; findings below may reference images not displayed]

----------------------------------------------------------------------

 ----------------------------------------------------------------------
Indications

  34 weeks gestation of pregnancy
  Polyhydramnios, third trimester, antepartum
  condition or complication, unspecified fetus
  Advanced maternal age multigravida 35+,
  third trimester
  Grand multiparity, antepartum
  Encounter for other antenatal screening
  follow-up
  Obesity complicating pregnancy, third
  trimester
 ----------------------------------------------------------------------
Vital Signs

                                                Height:        5'3"
Fetal Evaluation

 Num Of Fetuses:         1
 Fetal Heart Rate(bpm):  130
 Cardiac Activity:       Observed
 Presentation:           Cephalic

 Amniotic Fluid
 AFI FV:      Within normal limits

 AFI Sum(cm)     %Tile       Largest Pocket(cm)
 16.68           61

 RUQ(cm)       RLQ(cm)       LUQ(cm)        LLQ(cm)
 6.17          5.47          0
Biophysical Evaluation

 Amniotic F.V:   Within normal limits       F. Tone:        Observed
 F. Movement:    Observed                   Score:          [DATE]
 F. Breathing:   Observed
OB History

 Gravidity:    6         Term:   5        Prem:   0        SAB:   0
 TOP:          0       Ectopic:  0        Living: 5
Gestational Age

 LMP:           32w 2d        Date:  03/06/19                 EDD:   12/11/19
 Best:          34w 3d     Det. By:  Early Ultrasound         EDD:   11/26/19
                                     (04/07/19)
Anatomy

 Thoracic:              Appears normal         Bladder:                Appears normal
 Stomach:               Appears normal, left
                        sided
Impression

 Patient with polyhydramnios seen on previous scan is here
 for amniotic fluid evaluation.
 Amniotic fluid is normal and good fetal activity is seen.
 Antenatal testing is reassuring. BPP [DATE].
 We reassured the patient of the findings.
Recommendations

 -An appointment was made for her to return in 2 weeks for
 fetal growth and amniotic fluid assessments.
                 Sottou, Nastazia

## 2023-04-17 ENCOUNTER — Encounter: Payer: Self-pay | Admitting: Obstetrics and Gynecology

## 2023-04-17 ENCOUNTER — Ambulatory Visit (INDEPENDENT_AMBULATORY_CARE_PROVIDER_SITE_OTHER): Payer: Medicaid Other | Admitting: Obstetrics and Gynecology

## 2023-04-17 VITALS — BP 133/83 | HR 86 | Wt 230.5 lb

## 2023-04-17 DIAGNOSIS — O99282 Endocrine, nutritional and metabolic diseases complicating pregnancy, second trimester: Secondary | ICD-10-CM | POA: Diagnosis not present

## 2023-04-17 DIAGNOSIS — E039 Hypothyroidism, unspecified: Secondary | ICD-10-CM

## 2023-04-17 DIAGNOSIS — O09522 Supervision of elderly multigravida, second trimester: Secondary | ICD-10-CM | POA: Diagnosis not present

## 2023-04-17 DIAGNOSIS — O09529 Supervision of elderly multigravida, unspecified trimester: Secondary | ICD-10-CM | POA: Insufficient documentation

## 2023-04-17 DIAGNOSIS — N90810 Female genital mutilation status, unspecified: Secondary | ICD-10-CM

## 2023-04-17 DIAGNOSIS — Z603 Acculturation difficulty: Secondary | ICD-10-CM

## 2023-04-17 DIAGNOSIS — O09892 Supervision of other high risk pregnancies, second trimester: Secondary | ICD-10-CM

## 2023-04-17 DIAGNOSIS — Z758 Other problems related to medical facilities and other health care: Secondary | ICD-10-CM

## 2023-04-17 DIAGNOSIS — Z3A15 15 weeks gestation of pregnancy: Secondary | ICD-10-CM

## 2023-04-17 DIAGNOSIS — O09899 Supervision of other high risk pregnancies, unspecified trimester: Secondary | ICD-10-CM | POA: Insufficient documentation

## 2023-04-17 DIAGNOSIS — Z3480 Encounter for supervision of other normal pregnancy, unspecified trimester: Secondary | ICD-10-CM

## 2023-04-17 MED ORDER — ASPIRIN 81 MG PO CHEW: 81.0000 mg | CHEWABLE_TABLET | Freq: Every day | ORAL | 3 refills | Status: DC

## 2023-04-17 NOTE — Progress Notes (Signed)
NOB in office, speaks Somali Pregnancy confirmed at [facility] Pt is unsure of the of exact LMP Pt states that she is not feeling movement yet.

## 2023-04-17 NOTE — Progress Notes (Signed)
INITIAL PRENATAL VISIT NOTE  Subjective:  Tonya [name] is a 37 y.o. G7P6006 at [redacted]w[redacted]d by LMP being seen today for her initial prenatal visit.  She has an obstetric history significant for SVD x 6. She has a medical history significant for hypothyroid.  Patient reports no complaints.  Contractions: Not present. Vag. Bleeding: None.   . Denies leaking of fluid.    Past Medical History:  Diagnosis Date   Medical history non-contributory     Past Surgical History:  Procedure Laterality Date   NO PAST SURGERIES      OB History  Gravida Para Term Preterm AB Living  7 6 6     6   SAB IAB Ectopic Multiple Live Births        0 6    # Outcome Date GA Lbr Len/2nd Weight Sex Type Anes PTL Lv  7 Current           6 Term 11/22/19 [redacted]w[redacted]d 05:36 / 00:02 5 lb 15.4 oz (2.705 kg) M Vag-Spont Local  LIV  5 Term 10/20/17 [redacted]w[redacted]d 00:40 / 00:02 7 lb 8.5 oz (3.415 kg) F Vag-Spont None  LIV  4 Term 12/31/15 [redacted]w[redacted]d / 00:06 6 lb 3.1 oz (2.81 kg) F Vag-Spont None  LIV  3 Term     M Vag-Spont None N LIV  2 Term     M Vag-Spont None N LIV  1 Term     M Vag-Spont None N LIV    Social History   Socioeconomic History   Marital status: Married    Spouse name: Not on file   Number of children: 5   Years of education: Not on file   Highest education level: Not on file  Occupational History   Occupation: Unemployed  Tobacco Use   Smoking status: Never   Smokeless tobacco: Never  Vaping Use   Vaping status: Never Used  Substance and Sexual Activity   Alcohol use: No   Drug use: No   Sexual activity: Not Currently    Birth control/protection: None  Other Topics Concern   Not on file  Social History Narrative   Not on file   Social Determinants of Health   Financial Resource Strain: Not on file  Food Insecurity: Not on file  Transportation Needs: Not on file  Physical Activity: Not on file  Stress: Not on file  Social Connections: Not on file    Family History  Problem Relation Age of  Onset   Cancer Mother    Alcohol abuse Neg Hx    Arthritis Neg Hx    Asthma Neg Hx    Birth defects Neg Hx    COPD Neg Hx    Depression Neg Hx    Diabetes Neg Hx    Drug abuse Neg Hx    Early death Neg Hx    Hearing loss Neg Hx    Heart disease Neg Hx    Hyperlipidemia Neg Hx    Hypertension Neg Hx    Kidney disease Neg Hx    Learning disabilities Neg Hx    Mental illness Neg Hx    Mental retardation Neg Hx    Miscarriages / Stillbirths Neg Hx    Stroke Neg Hx    Vision loss Neg Hx    Varicose Veins Neg Hx      Current Outpatient Medications:    aspirin 81 MG chewable tablet, Chew 1 tablet (81 mg total) by mouth daily., Disp: 90 tablet, Rfl: 3  levothyroxine (SYNTHROID) 50 MCG tablet, Take 50 mcg by mouth daily before breakfast., Disp: , Rfl:    pantoprazole (PROTONIX) 40 MG tablet, Take 40 mg by mouth daily., Disp: , Rfl:    Prenatal Vit-Fe Fumarate-FA (MULTIVITAMIN-PRENATAL) 27-0.8 MG TABS tablet, Take 1 tablet by mouth daily at 12 noon., Disp: , Rfl:    acetaminophen (TYLENOL) 325 MG tablet, Take 2 tablets (650 mg total) by mouth every 4 (four) hours as needed for mild pain (for pain scale < 4). (Patient not taking: Reported on 12/27/2019), Disp: 30 tablet, Rfl: 0   ibuprofen (ADVIL) 600 MG tablet, Take 1 tablet (600 mg total) by mouth every 6 (six) hours. (Patient not taking: Reported on 12/27/2019), Disp: 30 tablet, Rfl: 0  No Known Allergies  Review of Systems: Negative except for what is mentioned in HPI.  Objective:   Vitals:   04/17/23 1124  BP: 133/83  Pulse: 86  Weight: 230 lb 8 oz (104.6 kg)    Fetal Status: Fetal Heart Rate (bpm): 152         Physical Exam: BP 133/83   Pulse 86   Wt 230 lb 8 oz (104.6 kg)   LMP 12/31/2022   BMI 42.16 kg/m  CONSTITUTIONAL: Well-developed, well-nourished female in no acute distress.  NEUROLOGIC: Alert and oriented to person, place, and time. Normal reflexes, muscle tone coordination. No cranial nerve deficit  noted. PSYCHIATRIC: Normal mood and affect. Normal behavior. Normal judgment and thought content. SKIN: Skin is warm and dry. No rash noted. Not diaphoretic. No erythema. No pallor. HENT:  Normocephalic, atraumatic, External right and left ear normal. Oropharynx is clear and moist EYES: Conjunctivae and EOM are normal.  NECK: Normal range of motion, supple, no masses CARDIOVASCULAR: Normal heart rate noted, regular rhythm RESPIRATORY: Effort and breath sounds normal, no problems with respiration noted BREASTS: deferred ABDOMEN: Soft, nontender, nondistended, gravid. GU: deferred due to cultural reasons MUSCULOSKELETAL: Normal range of motion. EXT:  No edema and no tenderness. 2+ distal pulses.   Assessment and Plan:  Pregnancy: G7P6006 at [redacted]w[redacted]d by LMP  1. 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 Language barrier Tele interpreter used  4. Multigravida of advanced maternal age in second trimester  - aspirin 81 MG chewable tablet; Chew 1 tablet (81 mg total) by mouth daily.  Dispense: 90 tablet; Refill: 3  5. Supervision of other normal pregnancy, antepartum Continue routine prenatal care  - CBC/D/Plt+RPR+Rh+ABO+RubIgG... - Culture, OB Urine - PANORAMA PRENATAL TEST - HORIZON Basic Panel - [place] MFM OB DETAIL +14 WK; Future  6. Hypothyroid in pregnancy, antepartum Consider thyroid panel at next visit  7. Supervision of other high risk pregnancy, antepartum, unspecified trimester Anatomy scan ordered  - aspirin 81 MG chewable tablet; Chew 1 tablet (81 mg total) by mouth daily.  Dispense: 90 tablet; Refill: 3   Preterm labor symptoms and general obstetric precautions including but not limited to vaginal bleeding, contractions, leaking of fluid and fetal movement were reviewed in detail with the patient.  Please refer to After Visit Summary for other counseling recommendations.   Return in about 4 weeks (around 05/15/2023) for [facility], in  person.  Warden Fillers 04/17/2023 1:15 PM

## 2023-04-18 LAB — CBC/D/PLT+RPR+RH+ABO+RUBIGG...
Antibody Screen: NEGATIVE
Basophils Absolute: 0 10*3/uL (ref 0.0–0.2)
Basos: 0 %
EOS (ABSOLUTE): 0.2 10*3/uL (ref 0.0–0.4)
Eos: 3 %
HCV Ab: NONREACTIVE
HIV Screen 4th Generation wRfx: NONREACTIVE
Hematocrit: 35.1 % (ref 34.0–46.6)
Hemoglobin: 11.7 g/dL (ref 11.1–15.9)
Hepatitis B Surface Ag: NEGATIVE
Immature Grans (Abs): 0 10*3/uL (ref 0.0–0.1)
Immature Granulocytes: 0 %
Lymphocytes Absolute: 1.5 10*3/uL (ref 0.7–3.1)
Lymphs: 23 %
MCH: 28.1 pg (ref 26.6–33.0)
MCHC: 33.3 g/dL (ref 31.5–35.7)
MCV: 84 fL (ref 79–97)
Monocytes Absolute: 0.6 10*3/uL (ref 0.1–0.9)
Monocytes: 8 %
Neutrophils Absolute: 4.5 10*3/uL (ref 1.4–7.0)
Neutrophils: 66 %
Platelets: 221 10*3/uL (ref 150–450)
RBC: 4.17 x10E6/uL (ref 3.77–5.28)
RDW: 14.1 % (ref 11.7–15.4)
RPR Ser Ql: NONREACTIVE
Rh Factor: POSITIVE
Rubella Antibodies, IGG: 1.34 {index} (ref >0.99)
WBC: 6.8 10*3/uL (ref 3.4–10.8)

## 2023-04-18 LAB — HCV INTERPRETATION

## 2023-04-19 LAB — CULTURE, OB URINE

## 2023-04-19 LAB — URINE CULTURE, OB REFLEX: Organism ID, Bacteria: NO GROWTH

## 2023-04-22 LAB — HORIZON CUSTOM

## 2023-04-25 LAB — PANORAMA PRENATAL TEST FULL PANEL:PANORAMA TEST PLUS 5 ADDITIONAL MICRODELETIONS: FETAL FRACTION: 9

## 2023-05-12 DIAGNOSIS — O99210 Obesity complicating pregnancy, unspecified trimester: Secondary | ICD-10-CM | POA: Insufficient documentation

## 2023-05-13 ENCOUNTER — Ambulatory Visit: Payer: Medicaid Other

## 2023-05-13 DIAGNOSIS — O99210 Obesity complicating pregnancy, unspecified trimester: Secondary | ICD-10-CM

## 2023-05-15 ENCOUNTER — Ambulatory Visit (INDEPENDENT_AMBULATORY_CARE_PROVIDER_SITE_OTHER): Payer: Medicaid Other | Admitting: Obstetrics and Gynecology

## 2023-05-15 ENCOUNTER — Other Ambulatory Visit (HOSPITAL_COMMUNITY)
Admission: RE | Admit: 2023-05-15 | Discharge: 2023-05-15 | Disposition: A | Discharge status: Home / Self Care | Payer: Medicaid Other | Source: Ambulatory Visit | Attending: Obstetrics and Gynecology | Admitting: Obstetrics and Gynecology

## 2023-05-15 ENCOUNTER — Encounter: Payer: Self-pay | Admitting: Obstetrics and Gynecology

## 2023-05-15 VITALS — BP 132/79 | HR 85 | Wt 232.0 lb

## 2023-05-15 DIAGNOSIS — N90810 Female genital mutilation status, unspecified: Secondary | ICD-10-CM

## 2023-05-15 DIAGNOSIS — E039 Hypothyroidism, unspecified: Secondary | ICD-10-CM

## 2023-05-15 DIAGNOSIS — Z124 Encounter for screening for malignant neoplasm of cervix: Secondary | ICD-10-CM | POA: Insufficient documentation

## 2023-05-15 DIAGNOSIS — Z3480 Encounter for supervision of other normal pregnancy, unspecified trimester: Secondary | ICD-10-CM

## 2023-05-15 DIAGNOSIS — O09522 Supervision of elderly multigravida, second trimester: Secondary | ICD-10-CM

## 2023-05-15 DIAGNOSIS — O99280 Endocrine, nutritional and metabolic diseases complicating pregnancy, unspecified trimester: Secondary | ICD-10-CM

## 2023-05-15 DIAGNOSIS — Z758 Other problems related to medical facilities and other health care: Secondary | ICD-10-CM

## 2023-05-15 DIAGNOSIS — Z603 Acculturation difficulty: Secondary | ICD-10-CM

## 2023-05-15 MED ORDER — PANTOPRAZOLE SODIUM 40 MG PO TBEC: 40.0000 mg | DELAYED_RELEASE_TABLET | Freq: Every day | ORAL | 3 refills | Status: DC

## 2023-05-15 NOTE — Progress Notes (Signed)
   PRENATAL VISIT NOTE  Subjective:  Tonya [name] is a 37 y.o. G7P6006 at [redacted]w[redacted]d being seen today for ongoing prenatal care.  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  Patient reports has pain on left left, near her knee. Not hot or erythematous. Contractions: Not present. Vag. Bleeding: None.  Movement: Present. Denies leaking of fluid.   The following portions of the patient's history were reviewed and updated as appropriate: allergies, current medications, past family history, past medical history, past social history, past surgical history and problem list.   Objective:   Vitals:   05/15/23 1008  BP: 132/79  Pulse: 85  Weight: 232 lb (105.2 kg)    Fetal Status:     Movement: Present     General:  Alert, oriented and cooperative. Patient is in no acute distress.  Skin: Skin is warm and dry. No rash noted.   Cardiovascular: Normal heart rate noted  Respiratory: Normal respiratory effort, no problems with respiration noted  Abdomen: Soft, gravid, appropriate for gestational age.  Pain/Pressure: Absent     Pelvic: Cervical exam deferred        Extremities: Normal range of motion.  Edema: Trace varicose veins noted left leg  Mental Status: Normal mood and affect. Normal behavior. Normal judgment and thought content.   Assessment and Plan:  Pregnancy: G7P6006 at [redacted]w[redacted]d  1. Supervision of other normal pregnancy, antepartum BP and FHR normal  Supportive measures discussed for left leg varicose veins, compression socks   2. Hypothyroid in pregnancy, antepartum On synthroid, checking labs today - TSH - T4, free  3.  Multigravida of advanced maternal age in second trimester Has not been taking ASA , discussed recommendation for ASA as a preventative   4. Language barrier In person interpreter   5. 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 Cervical cancer screening  - Cytology - PAP( [place])  Preterm labor symptoms and general obstetric precautions including but not limited to vaginal bleeding, contractions, leaking of fluid and fetal movement were reviewed in detail with the patient. Please refer to After Visit Summary for other counseling recommendations.   Return in about 4 weeks (around 06/12/2023) for OB VISIT (MD or APP).  Future Appointments  Date Time Provider Department Center  06/06/2023 12:15 PM [facility] NURSE [facility]  06/06/2023 12:30 PM [facility] US5 [facility]  06/18/2023  3:50 PM Sue Lush, FNP [facility] None  07/09/2023  8:00 AM [facility] LAB [facility] None  07/09/2023  8:55 AM Sue Lush, FNP [facility] None    Albertine Grates, FNP

## 2023-05-15 NOTE — Progress Notes (Signed)
Pt presents for ROB visit. Pt c/o pain and swelling in the left leg. Needs refill of protonix. Declines  Wants PAP today

## 2023-05-16 LAB — TSH: TSH: 3.84 u[IU]/mL (ref 0.450–4.500)

## 2023-05-16 LAB — T4, FREE: Free T4: 0.86 ng/dL (ref 0.82–1.77)

## 2023-05-22 LAB — CYTOLOGY - PAP
Comment: NEGATIVE
Diagnosis: NEGATIVE
High risk HPV: NEGATIVE

## 2023-06-06 ENCOUNTER — Encounter: Payer: Self-pay | Admitting: *Deleted

## 2023-06-06 ENCOUNTER — Ambulatory Visit (HOSPITAL_BASED_OUTPATIENT_CLINIC_OR_DEPARTMENT_OTHER): Payer: Medicaid Other

## 2023-06-06 ENCOUNTER — Ambulatory Visit: Payer: Medicaid Other | Attending: Obstetrics and Gynecology | Admitting: *Deleted

## 2023-06-06 ENCOUNTER — Other Ambulatory Visit: Payer: Self-pay | Admitting: *Deleted

## 2023-06-06 VITALS — BP 100/61 | HR 88

## 2023-06-06 DIAGNOSIS — O99282 Endocrine, nutritional and metabolic diseases complicating pregnancy, second trimester: Secondary | ICD-10-CM | POA: Diagnosis not present

## 2023-06-06 DIAGNOSIS — O0942 Supervision of pregnancy with grand multiparity, second trimester: Secondary | ICD-10-CM

## 2023-06-06 DIAGNOSIS — O09522 Supervision of elderly multigravida, second trimester: Secondary | ICD-10-CM | POA: Insufficient documentation

## 2023-06-06 DIAGNOSIS — Z3480 Encounter for supervision of other normal pregnancy, unspecified trimester: Secondary | ICD-10-CM | POA: Diagnosis not present

## 2023-06-06 DIAGNOSIS — Z3A22 22 weeks gestation of pregnancy: Secondary | ICD-10-CM

## 2023-06-06 DIAGNOSIS — E039 Hypothyroidism, unspecified: Secondary | ICD-10-CM

## 2023-06-06 DIAGNOSIS — Z363 Encounter for antenatal screening for malformations: Secondary | ICD-10-CM | POA: Diagnosis not present

## 2023-06-06 DIAGNOSIS — E669 Obesity, unspecified: Secondary | ICD-10-CM | POA: Diagnosis not present

## 2023-06-06 DIAGNOSIS — O99212 Obesity complicating pregnancy, second trimester: Secondary | ICD-10-CM | POA: Diagnosis not present

## 2023-06-06 DIAGNOSIS — Z362 Encounter for other antenatal screening follow-up: Secondary | ICD-10-CM

## 2023-06-18 ENCOUNTER — Ambulatory Visit (INDEPENDENT_AMBULATORY_CARE_PROVIDER_SITE_OTHER): Payer: Medicaid Other | Admitting: Obstetrics and Gynecology

## 2023-06-18 VITALS — BP 102/62 | HR 87 | Wt 234.8 lb

## 2023-06-18 DIAGNOSIS — Z758 Other problems related to medical facilities and other health care: Secondary | ICD-10-CM

## 2023-06-18 DIAGNOSIS — E039 Hypothyroidism, unspecified: Secondary | ICD-10-CM

## 2023-06-18 DIAGNOSIS — O09522 Supervision of elderly multigravida, second trimester: Secondary | ICD-10-CM

## 2023-06-18 DIAGNOSIS — Z3A24 24 weeks gestation of pregnancy: Secondary | ICD-10-CM

## 2023-06-18 DIAGNOSIS — Z603 Acculturation difficulty: Secondary | ICD-10-CM

## 2023-06-18 DIAGNOSIS — Z3480 Encounter for supervision of other normal pregnancy, unspecified trimester: Secondary | ICD-10-CM

## 2023-06-18 DIAGNOSIS — N90810 Female genital mutilation status, unspecified: Secondary | ICD-10-CM

## 2023-06-18 DIAGNOSIS — O99280 Endocrine, nutritional and metabolic diseases complicating pregnancy, unspecified trimester: Secondary | ICD-10-CM

## 2023-06-18 MED ORDER — ASPIRIN 81 MG PO TBEC: 81.0000 mg | DELAYED_RELEASE_TABLET | Freq: Every day | ORAL | 2 refills | Status: DC

## 2023-06-18 NOTE — Progress Notes (Signed)
Pt. Presents for rob. Pt. Has been having dizziness for the past 2 days.

## 2023-06-18 NOTE — Progress Notes (Signed)
   PRENATAL VISIT NOTE  Subjective:  Tonya [name] is a 37 y.o. G7P6006 at [redacted]w[redacted]d being seen today for ongoing prenatal care.  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  Patient reports  dizziness,  .  Contractions: Not present. Vag. Bleeding: None.  Movement: Present. Denies leaking of fluid.   The following portions of the patient's history were reviewed and updated as appropriate: allergies, current medications, past family history, past medical history, past social history, past surgical history and problem list.   Objective:   Vitals:   06/18/23 1606  BP: 102/62  Pulse: 87  Weight: 234 lb 12.8 oz (106.5 kg)    Fetal Status:     Movement: Present     General:  Alert, oriented and cooperative. Patient is in no acute distress.  Skin: Skin is warm and dry. No rash noted.   Cardiovascular: Normal heart rate noted  Respiratory: Normal respiratory effort, no problems with respiration noted  Abdomen: Soft, gravid, appropriate for gestational age.  Pain/Pressure: Absent     Pelvic: Cervical exam deferred        Extremities: Normal range of motion.  Edema: None  Mental Status: Normal mood and affect. Normal behavior. Normal judgment and thought content.   Assessment and Plan:  1. Supervision of other normal pregnancy, antepartum BP and FHR normal Feeling regular fetal movement   2. Hypothyroid in pregnancy, antepartum On 50 mcg synthroid   3. Multigravida of advanced maternal age in second trimester New rx ASA sent  Follow up u/s 118  4. Language barrier Interpreter present   5. 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 [redacted]  weeks gestation of pregnancy Supportive measures for dizziness, increase hydration, small frequent meals, rest breaks when cooking    Preterm labor symptoms and general obstetric precautions including but not limited to vaginal bleeding, contractions, leaking of fluid and fetal movement were reviewed in detail with the patient. Please refer to After Visit Summary for other counseling recommendations.   Return in 3 weeks for routine prenatal and early GTT   Future Appointments  Date Time Provider Department Center  07/04/2023 12:15 PM [facility] NURSE [facility]  07/04/2023 12:30 PM [facility] US3 [facility]  07/10/2023  8:15 AM [facility] LAB [facility] None  07/10/2023  8:55 AM Sue Lush, FNP [facility] None    Albertine Grates, FNP

## 2023-06-30 ENCOUNTER — Encounter: Payer: Medicaid Other | Admitting: Advanced Practice Midwife

## 2023-07-04 ENCOUNTER — Ambulatory Visit: Payer: Medicaid Other

## 2023-07-04 ENCOUNTER — Other Ambulatory Visit: Payer: Self-pay | Admitting: *Deleted

## 2023-07-04 ENCOUNTER — Ambulatory Visit: Payer: Medicaid Other | Attending: Obstetrics

## 2023-07-04 DIAGNOSIS — O99280 Endocrine, nutritional and metabolic diseases complicating pregnancy, unspecified trimester: Secondary | ICD-10-CM | POA: Insufficient documentation

## 2023-07-04 DIAGNOSIS — O3662X0 Maternal care for excessive fetal growth, second trimester, not applicable or unspecified: Secondary | ICD-10-CM

## 2023-07-04 DIAGNOSIS — O99213 Obesity complicating pregnancy, third trimester: Secondary | ICD-10-CM

## 2023-07-04 DIAGNOSIS — E669 Obesity, unspecified: Secondary | ICD-10-CM

## 2023-07-04 DIAGNOSIS — O99212 Obesity complicating pregnancy, second trimester: Secondary | ICD-10-CM | POA: Diagnosis present

## 2023-07-04 DIAGNOSIS — O99282 Endocrine, nutritional and metabolic diseases complicating pregnancy, second trimester: Secondary | ICD-10-CM

## 2023-07-04 DIAGNOSIS — Z362 Encounter for other antenatal screening follow-up: Secondary | ICD-10-CM | POA: Diagnosis present

## 2023-07-04 DIAGNOSIS — O09522 Supervision of elderly multigravida, second trimester: Secondary | ICD-10-CM | POA: Diagnosis present

## 2023-07-04 DIAGNOSIS — E039 Hypothyroidism, unspecified: Secondary | ICD-10-CM | POA: Diagnosis present

## 2023-07-04 DIAGNOSIS — Z3A26 26 weeks gestation of pregnancy: Secondary | ICD-10-CM

## 2023-07-04 DIAGNOSIS — O0942 Supervision of pregnancy with grand multiparity, second trimester: Secondary | ICD-10-CM | POA: Diagnosis not present

## 2023-07-04 DIAGNOSIS — O09523 Supervision of elderly multigravida, third trimester: Secondary | ICD-10-CM

## 2023-07-09 ENCOUNTER — Other Ambulatory Visit: Payer: Medicaid Other

## 2023-07-09 ENCOUNTER — Encounter: Payer: Medicaid Other | Admitting: Obstetrics & Gynecology

## 2023-07-09 ENCOUNTER — Encounter: Payer: Medicaid Other | Admitting: Obstetrics and Gynecology

## 2023-07-10 ENCOUNTER — Other Ambulatory Visit: Payer: Medicaid Other

## 2023-07-10 ENCOUNTER — Ambulatory Visit (INDEPENDENT_AMBULATORY_CARE_PROVIDER_SITE_OTHER): Payer: Medicaid Other | Admitting: Obstetrics and Gynecology

## 2023-07-10 VITALS — BP 115/79 | HR 83 | Wt 235.4 lb

## 2023-07-10 DIAGNOSIS — Z23 Encounter for immunization: Secondary | ICD-10-CM | POA: Diagnosis not present

## 2023-07-10 DIAGNOSIS — Z3A27 27 weeks gestation of pregnancy: Secondary | ICD-10-CM

## 2023-07-10 DIAGNOSIS — E039 Hypothyroidism, unspecified: Secondary | ICD-10-CM

## 2023-07-10 DIAGNOSIS — O09892 Supervision of other high risk pregnancies, second trimester: Secondary | ICD-10-CM

## 2023-07-10 DIAGNOSIS — Z3480 Encounter for supervision of other normal pregnancy, unspecified trimester: Secondary | ICD-10-CM

## 2023-07-10 DIAGNOSIS — Z603 Acculturation difficulty: Secondary | ICD-10-CM

## 2023-07-10 DIAGNOSIS — O09522 Supervision of elderly multigravida, second trimester: Secondary | ICD-10-CM

## 2023-07-10 DIAGNOSIS — Z758 Other problems related to medical facilities and other health care: Secondary | ICD-10-CM

## 2023-07-10 DIAGNOSIS — O3662X0 Maternal care for excessive fetal growth, second trimester, not applicable or unspecified: Secondary | ICD-10-CM

## 2023-07-10 DIAGNOSIS — O99012 Anemia complicating pregnancy, second trimester: Secondary | ICD-10-CM

## 2023-07-10 DIAGNOSIS — N90810 Female genital mutilation status, unspecified: Secondary | ICD-10-CM

## 2023-07-10 DIAGNOSIS — O09899 Supervision of other high risk pregnancies, unspecified trimester: Secondary | ICD-10-CM

## 2023-07-10 DIAGNOSIS — O99282 Endocrine, nutritional and metabolic diseases complicating pregnancy, second trimester: Secondary | ICD-10-CM

## 2023-07-10 NOTE — Progress Notes (Signed)
   PRENATAL VISIT NOTE  Subjective:  Elayne Kipper is a 37 y.o. G7P6006 at [redacted]w[redacted]d being seen today for ongoing prenatal care.  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  Patient reports no complaints.  Contractions: Not present. Vag. Bleeding: None.  Movement: Present. Denies leaking of fluid.   The following portions of the patient's history were reviewed and updated as appropriate: allergies, current medications, past family history, past medical history, past social history, past surgical history and problem list.   Objective:   Vitals:   07/10/23 0838  BP: 115/79  Pulse: 83  Weight: 235 lb 6.4 oz (106.8 kg)    Fetal Status: Fetal Heart Rate (bpm): 147   Movement: Present     General:  Alert, oriented and cooperative. Patient is in no acute distress.  Skin: Skin is warm and dry. No rash noted.   Cardiovascular: Normal heart rate noted  Respiratory: Normal respiratory effort, no problems with respiration noted  Abdomen: Soft, gravid, appropriate for gestational age.  Pain/Pressure: Absent     Pelvic: Cervical exam deferred        Extremities: Normal range of motion.  Edema: None  Mental Status: Normal mood and affect. Normal behavior. Normal judgment and thought content.   Assessment and Plan:  Pregnancy: G7P6006 at [redacted]w[redacted]d 1. Supervision of other normal pregnancy, antepartum BP and FHR normal Doing well, feeling regular movement   - HIV antibody (with reflex) - CBC - RPR - Glucose Tolerance, 2 Hours w/1 Hour  2. Hypothyroid in pregnancy, antepartum On synthroid daily  - TSH Rfx on Abnormal to Free T4  3. Multigravida  of advanced maternal age in second trimester Normal NIPs,continue ASA  4. Language barrier Interpreter present   5. 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 [redacted] weeks gestation of pregnancy GTT and labs today  Tdap given today  7. Excessive fetal growth affecting management of pregnancy in second trimester, single or unspecified fetus 11/8 u/s efw 99%, ac 83%, normal afi  Follow up 12/10 , bpp starting 34 weeks   Preterm labor symptoms and general obstetric precautions including but not limited to vaginal bleeding, contractions, leaking of fluid and fetal movement were reviewed in detail with the patient. Please refer to After Visit Summary for other counseling recommendations.   Return in two weeks for routine prenatal   Albertine Grates, FNP

## 2023-07-10 NOTE — Progress Notes (Signed)
Pt presents for ROB visit. Requesting Tdap, Declines Flu vaccine.

## 2023-07-11 LAB — CBC
Hematocrit: 30.9 % — ABNORMAL LOW (ref 34.0–46.6)
Hemoglobin: 9.9 g/dL — ABNORMAL LOW (ref 11.1–15.9)
MCH: 27.2 pg (ref 26.6–33.0)
MCHC: 32 g/dL (ref 31.5–35.7)
MCV: 85 fL (ref 79–97)
Platelets: 209 10*3/uL (ref 150–450)
RBC: 3.64 x10E6/uL — ABNORMAL LOW (ref 3.77–5.28)
RDW: 13.6 % (ref 11.7–15.4)
WBC: 6.4 10*3/uL (ref 3.4–10.8)

## 2023-07-11 LAB — RPR: RPR Ser Ql: NONREACTIVE

## 2023-07-11 LAB — GLUCOSE TOLERANCE, 2 HOURS W/ 1HR
Glucose, 1 hour: 140 mg/dL (ref 70–179)
Glucose, 2 hour: 111 mg/dL (ref 70–152)
Glucose, Fasting: 82 mg/dL (ref 70–91)

## 2023-07-11 LAB — HIV ANTIBODY (ROUTINE TESTING W REFLEX): HIV Screen 4th Generation wRfx: NONREACTIVE

## 2023-07-11 MED ORDER — FERROUS GLUCONATE 324 (38 FE) MG PO TABS: 324.0000 mg | ORAL_TABLET | Freq: Every day | ORAL | 3 refills | Status: AC

## 2023-07-11 NOTE — Addendum Note (Signed)
Addended by: Sue Lush on: 07/11/2023 08:25 AM   Modules accepted: Orders

## 2023-07-14 LAB — SPECIMEN STATUS REPORT

## 2023-07-14 LAB — TSH RFX ON ABNORMAL TO FREE T4: TSH: 5.48 u[IU]/mL — ABNORMAL HIGH (ref 0.450–4.500)

## 2023-07-14 LAB — T4F: T4,Free (Direct): 0.89 ng/dL (ref 0.82–1.77)

## 2023-07-17 ENCOUNTER — Encounter: Payer: Medicaid Other | Admitting: Obstetrics and Gynecology

## 2023-07-22 ENCOUNTER — Encounter: Payer: Medicaid Other | Admitting: Advanced Practice Midwife

## 2023-07-22 NOTE — Progress Notes (Deleted)
   PRENATAL VISIT NOTE  Subjective:  Tonya [name] is a 37 y.o. G7P6006 at [redacted]w[redacted]d being seen today for ongoing prenatal care.  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  Patient reports {sx:14538}.   .  .   . Denies leaking of fluid.   The following portions of the patient's history were reviewed and updated as appropriate: allergies, current medications, past family history, past medical history, past social history, past surgical history and problem list.   Objective:  There were no vitals filed for this visit.  Fetal Status:           General:  Alert, oriented and cooperative. Patient is in no acute distress.  Skin: Skin is warm and dry. No rash noted.   Cardiovascular: Normal heart rate noted  Respiratory: Normal respiratory effort, no problems with respiration noted  Abdomen: Soft, gravid, appropriate for gestational age.        Pelvic: {Blank single:19197::"Cervical exam performed in the presence of a chaperone","Cervical exam deferred"}        Extremities: Normal range of motion.     Mental Status: Normal mood and affect. Normal behavior. Normal judgment and thought content.   Assessment and Plan:  Pregnancy: G7P6006 at [redacted]w[redacted]d 1. Supervision of other normal pregnancy, antepartum ***  2. Hypothyroid in pregnancy, antepartum ***  3. Multigravida of advanced maternal age in second trimester ***  4. Language barrier ***  5. Excessive fetal growth affecting management of pregnancy in second trimester, single or unspecified fetus --EFW 99%  6. Obesity affecting pregnancy, antepartum,  unspecified obesity type --BMI 42, BPPs weekly at 32 weeks  {Blank single:19197::"Term","Preterm"} labor symptoms and general obstetric precautions including but not limited to vaginal bleeding, contractions, leaking of fluid and fetal movement were reviewed in detail with the patient. Please refer to After Visit Summary for other counseling recommendations.   No follow-ups on file.  Future Appointments  Date Time Provider Department Center  07/22/2023  2:30 PM Hurshel Party, CNM [facility] None  07/31/2023  4:10 PM Gerrit Heck, CNM [facility] None  08/05/2023 11:30 AM [facility] US5 [facility]  08/13/2023  3:30 PM Sue Lush, FNP [facility] None  08/18/2023  4:10 PM Sue Lush, FNP [facility] None  08/25/2023  4:10 PM Sue Lush, FNP [facility] None  09/03/2023 11:30 AM [facility] US5 [facility]    Sharen Counter, CNM

## 2023-07-31 ENCOUNTER — Ambulatory Visit: Payer: Medicaid Other

## 2023-07-31 VITALS — BP 124/73 | HR 88 | Wt 233.6 lb

## 2023-07-31 DIAGNOSIS — Z3480 Encounter for supervision of other normal pregnancy, unspecified trimester: Secondary | ICD-10-CM

## 2023-07-31 DIAGNOSIS — Z3A30 30 weeks gestation of pregnancy: Secondary | ICD-10-CM | POA: Diagnosis not present

## 2023-07-31 DIAGNOSIS — O99283 Endocrine, nutritional and metabolic diseases complicating pregnancy, third trimester: Secondary | ICD-10-CM

## 2023-07-31 DIAGNOSIS — E039 Hypothyroidism, unspecified: Secondary | ICD-10-CM

## 2023-07-31 DIAGNOSIS — O99280 Endocrine, nutritional and metabolic diseases complicating pregnancy, unspecified trimester: Secondary | ICD-10-CM

## 2023-07-31 MED ORDER — LEVOTHYROXINE SODIUM 75 MCG PO TABS: 75.0000 ug | ORAL_TABLET | Freq: Every day | ORAL | 4 refills | Status: AC

## 2023-07-31 NOTE — Progress Notes (Signed)
   HIGH-RISK PREGNANCY OFFICE VISIT  Patient name: Tonya [name] MRN [id number]  Date of birth: Jul 16, 1986 Chief Complaint:   Routine Prenatal Visit  Subjective:   Tonya [name] is a 37 y.o. 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  Patient presents today, with FOB-Jama, with no complaints.  However, patient does request refill on synthroid.  She reports she ran out ~ 4 days ago. Patient endorses fetal movement. Patient denies abdominal cramping or contractions.  Patient denies vaginal concerns including abnormal discharge, leaking of fluid, and bleeding. No issues with urination, constipation, or diarrhea.    Contractions: Not present. Vag. Bleeding: None.  Movement: Present.  Reviewed past medical,surgical, social, obstetrical and family history as well as problem list, medications and allergies.  Objective   Vitals:   07/31/23 1615  BP: 124/73  Pulse: 88  Weight: 233 lb 9.6 oz (106 kg)  Body mass index is 42.73 kg/m.  Total Weight Gain:1 lb 9.6 oz (0.726 kg)         Physical Examination:   General appearance: Well appearing, and in no distress  Mental status: Alert, oriented to person, place, and time  Skin: Warm & dry  Cardiovascular: Normal heart rate noted  Respiratory: Normal respiratory effort, no distress  Abdomen: Not assessed  Pelvic: Cervical exam deferred           Extremities: Edema: None  Fetal Status: Fetal Heart Rate (bpm): 136  Movement: Present   No results found for this or any previous visit (from the past 24 hour(s)).  Assessment & Plan:   High-risk pregnancy of a 37 y.o., G7P6006 at [redacted]w[redacted]d with an Estimated Date of Delivery: 10/06/23   1. Supervision of other normal pregnancy, antepartum -Anticipatory guidance for upcoming appts. -Patient to schedule next appt in 2 weeks for an in-person visit.   2. Hypothyroid in pregnancy, antepartum -11/14 TSH 5.89, T4: 0.89 -Patient states she was taking synthroid, but did not take that morning. -Reviewed with Dr. Marcie Bal who recommends increasing synthroid. -New script for 75 mcg sent to pharmacy on file.  -Repeat TSH, T4 in 4-6 week.   3. [redacted] weeks gestation of pregnancy -Doing well overall. -FOB: Jama acting as interpreter      Meds: No orders of the defined types were placed in this encounter.  Labs/procedures today:  Lab Orders  No laboratory test(s) ordered today     Reviewed: Preterm labor symptoms and general obstetric precautions including but not limited to vaginal bleeding, contractions, leaking of fluid and fetal movement were reviewed in detail with the patient.  All questions were answered.  Follow-up: No follow-ups on file.  No orders of the defined types were placed in this encounter.  Cherre Robins MSN, CNM 07/31/2023

## 2023-07-31 NOTE — Progress Notes (Signed)
Pt requesting refill of Synthroid. No other concerns

## 2023-08-05 ENCOUNTER — Ambulatory Visit: Payer: Medicaid Other | Attending: Obstetrics and Gynecology

## 2023-08-13 ENCOUNTER — Ambulatory Visit (INDEPENDENT_AMBULATORY_CARE_PROVIDER_SITE_OTHER): Payer: Medicaid Other | Admitting: Obstetrics and Gynecology

## 2023-08-13 ENCOUNTER — Encounter: Payer: Self-pay | Admitting: Obstetrics and Gynecology

## 2023-08-13 VITALS — BP 124/77 | HR 100 | Wt 235.6 lb

## 2023-08-13 DIAGNOSIS — Z3A32 32 weeks gestation of pregnancy: Secondary | ICD-10-CM

## 2023-08-13 DIAGNOSIS — Z603 Acculturation difficulty: Secondary | ICD-10-CM

## 2023-08-13 DIAGNOSIS — E039 Hypothyroidism, unspecified: Secondary | ICD-10-CM

## 2023-08-13 DIAGNOSIS — Z758 Other problems related to medical facilities and other health care: Secondary | ICD-10-CM

## 2023-08-13 DIAGNOSIS — Z3480 Encounter for supervision of other normal pregnancy, unspecified trimester: Secondary | ICD-10-CM

## 2023-08-13 DIAGNOSIS — O99283 Endocrine, nutritional and metabolic diseases complicating pregnancy, third trimester: Secondary | ICD-10-CM

## 2023-08-13 DIAGNOSIS — O3662X0 Maternal care for excessive fetal growth, second trimester, not applicable or unspecified: Secondary | ICD-10-CM

## 2023-08-13 NOTE — Progress Notes (Signed)
Pt presents for ROB visit.  

## 2023-08-13 NOTE — Progress Notes (Signed)
   PRENATAL VISIT NOTE  Subjective:  Tonya [name] is a 37 y.o. G7P6006 at [redacted]w[redacted]d being seen today for ongoing prenatal care.  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  Patient reports no complaints.  Contractions: Not present. Vag. Bleeding: None.  Movement: Present. Denies leaking of fluid.   The following portions of the patient's history were reviewed and updated as appropriate: allergies, current medications, past family history, past medical history, past social history, past surgical history and problem list.   Objective:   Vitals:   08/13/23 1540  BP: 124/77  Pulse: 100  Weight: 235 lb 9.6 oz (106.9 kg)    Fetal Status: Fetal Heart Rate (bpm): 148   Movement: Present     General:  Alert, oriented and cooperative. Patient is in no acute distress.  Skin: Skin is warm and dry. No rash noted.   Cardiovascular: Normal heart rate noted  Respiratory: Normal respiratory effort, no problems with respiration noted  Abdomen: Soft, gravid, appropriate for gestational age.  Pain/Pressure: Present     Pelvic: Cervical exam deferred        Extremities: Normal range of motion.  Edema: None  Mental Status: Normal mood and affect. Normal behavior. Normal judgment and thought content.   Assessment and Plan:  Pregnancy: G7P6006 at [redacted]w[redacted]d 1. Supervision of other normal pregnancy, antepartum (Primary) BP and FHR normal Doing well, feeling regular movement    2. Hypothyroid in pregnancy, antepartum Taking synthroid Repeat TSH next visit   3. [redacted] weeks gestation of pregnancy Anticipatory guidance regarding upcoming appts    4.  Excessive fetal growth affecting management of pregnancy in second trimester, single or unspecified fetus 11/8 EFW 99%, normal AFI  Start BPPs weekly starting next week Follow up growth 1/8   5. Language barrier Ipad interpreter used for    Preterm labor symptoms and general obstetric precautions including but not limited to vaginal bleeding, contractions, leaking of fluid and fetal movement were reviewed in detail with the patient. Please refer to After Visit Summary for other counseling recommendations.   Return in two weeks for routine prenatal   Future Appointments  Date Time Provider Department Center  08/18/2023  4:10 PM Sue Lush, FNP [facility] None  08/25/2023  4:10 PM Sue Lush, FNP [facility] None  09/03/2023 11:30 AM [facility] US5 [facility]    Albertine Grates, FNP

## 2023-08-18 ENCOUNTER — Encounter: Payer: Medicaid Other | Admitting: Obstetrics and Gynecology

## 2023-08-25 ENCOUNTER — Ambulatory Visit (INDEPENDENT_AMBULATORY_CARE_PROVIDER_SITE_OTHER): Payer: Medicaid Other | Admitting: Obstetrics and Gynecology

## 2023-08-25 VITALS — BP 138/80 | HR 98 | Wt 236.2 lb

## 2023-08-25 DIAGNOSIS — O3662X0 Maternal care for excessive fetal growth, second trimester, not applicable or unspecified: Secondary | ICD-10-CM

## 2023-08-25 DIAGNOSIS — Z603 Acculturation difficulty: Secondary | ICD-10-CM

## 2023-08-25 DIAGNOSIS — Z3A34 34 weeks gestation of pregnancy: Secondary | ICD-10-CM

## 2023-08-25 DIAGNOSIS — E039 Hypothyroidism, unspecified: Secondary | ICD-10-CM

## 2023-08-25 DIAGNOSIS — O99280 Endocrine, nutritional and metabolic diseases complicating pregnancy, unspecified trimester: Secondary | ICD-10-CM

## 2023-08-25 DIAGNOSIS — Z3480 Encounter for supervision of other normal pregnancy, unspecified trimester: Secondary | ICD-10-CM

## 2023-08-25 DIAGNOSIS — Z758 Other problems related to medical facilities and other health care: Secondary | ICD-10-CM

## 2023-08-25 NOTE — Progress Notes (Signed)
   PRENATAL VISIT NOTE  Subjective:  Tonya [name] is a 37 y.o. G7P6006 at [redacted]w[redacted]d being seen today for ongoing prenatal care.  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  Patient reports no complaints.  Contractions: Irritability. Vag. Bleeding: None.  Movement: Present. Denies leaking of fluid.   The following portions of the patient's history were reviewed and updated as appropriate: allergies, current medications, past family history, past medical history, past social history, past surgical history and problem list.   Objective:   Vitals:   08/25/23 1624  BP: 138/80  Pulse: 98  Weight: 236 lb 3.2 oz (107.1 kg)    Fetal Status: Fetal Heart Rate (bpm): 140 Fundal Height: 34 cm Movement: Present     General:  Alert, oriented and cooperative. Patient is in no acute distress.  Skin: Skin is warm and dry. No rash noted.   Cardiovascular: Normal heart rate noted  Respiratory: Normal respiratory effort, no problems with respiration noted  Abdomen: Soft, gravid, appropriate for gestational age.  Pain/Pressure: Present (pain at night)     Pelvic: Cervical exam deferred        Extremities: Normal range of motion.  Edema: None  Mental Status: Normal mood and affect. Normal behavior. Normal judgment and thought content.   Assessment and Plan:  Pregnancy: G7P6006 at [redacted]w[redacted]d 1. Supervision of other normal pregnancy, antepartum (Primary) BP and FHR normal Doing well, feeling regular movement     2. Hypothyroid in pregnancy, antepartum On synthroid 75 mcg Repeat TSH today   3. [redacted] weeks gestation of pregnancy Anticipatory guidance  regarding swabs next visit  4. Excessive fetal growth affecting management of pregnancy in second trimester, single or unspecified fetus 11/8 EFW 99%, normal AFI  FH appropriate Follow up u/s 1/8 as well as BPP   5. Language barrier Interpreter present for visit    Preterm labor symptoms and general obstetric precautions including but not limited to vaginal bleeding, contractions, leaking of fluid and fetal movement were reviewed in detail with the patient. Please refer to After Visit Summary for other counseling recommendations.   Return in about 2 weeks (around 09/08/2023) for OB VISIT (MD or APP).  Future Appointments  Date Time Provider Department Center  09/03/2023 11:30 AM [facility] US5 [facility]    Albertine Grates, FNP

## 2023-08-25 NOTE — Progress Notes (Signed)
Pt. Presents for rob. Pt. Has no questions or concerns at this time.

## 2023-08-26 LAB — TSH RFX ON ABNORMAL TO FREE T4: TSH: 2.25 u[IU]/mL (ref 0.450–4.500)

## 2023-08-27 NOTE — L&D Delivery Note (Signed)
 OB/GYN Faculty Practice Delivery Note  Tonya [name] is a 38 y.o. H2E3993 s/p NSVD at [redacted]w[redacted]d. She was admitted for IOL for BMI.   ROM: 4h 64m with clear fluid GBS Status: positive treated with PCN Maximum Maternal Temperature: 98.7  Labor Progress: Dual cytotec , Cooks, Pitocin   Delivery Date/Time: 10/03/2023 @ 1032 Delivery: Upon rounding on patient she reported feeling constant vaginal pressure. SVE was complete but contractions spaced out. Pitocin  increased. Pushed over 30 minutes. Head delivered OA. Restituted LOT. Tight nuchal cord x 1 present. Shoulder dystocia identified. HOB lowered, Unable to rotate posterior axilla or deliver posterior arm. NICU team called. Dr. Lola present at 1 minute and 20 seconds. See attending attestation for delivery details. Placenta delivered spontaneously with gentle cord traction. Fundus firm with massage and Pitocin . Labia, perineum, vagina, and cervix inspected with 1 degree perineal laceration repaired in the usual fashion. Newborn resuscitated and brought over to mom for skin to skin.   Placenta: Intact, to L&D Complications: Shoulder Dystocia Lacerations: 1st degree perineal with repair EBL: 76 Analgesia: Epidural   Infant: Girl  Tonya [name] 4/9  weight pending  Tonya [name], Student-MidWife  10/03/2023 10:53 AM;

## 2023-08-29 ENCOUNTER — Encounter: Payer: Self-pay | Admitting: *Deleted

## 2023-08-29 DIAGNOSIS — O0943 Supervision of pregnancy with grand multiparity, third trimester: Secondary | ICD-10-CM | POA: Insufficient documentation

## 2023-08-29 DIAGNOSIS — O3660X0 Maternal care for excessive fetal growth, unspecified trimester, not applicable or unspecified: Secondary | ICD-10-CM | POA: Insufficient documentation

## 2023-09-03 ENCOUNTER — Other Ambulatory Visit: Payer: Self-pay | Admitting: *Deleted

## 2023-09-03 ENCOUNTER — Encounter: Payer: Medicaid Other | Admitting: Obstetrics and Gynecology

## 2023-09-03 ENCOUNTER — Ambulatory Visit: Payer: Medicaid Other | Attending: Obstetrics and Gynecology

## 2023-09-03 DIAGNOSIS — O09523 Supervision of elderly multigravida, third trimester: Secondary | ICD-10-CM

## 2023-09-03 DIAGNOSIS — O99283 Endocrine, nutritional and metabolic diseases complicating pregnancy, third trimester: Secondary | ICD-10-CM

## 2023-09-03 DIAGNOSIS — O99213 Obesity complicating pregnancy, third trimester: Secondary | ICD-10-CM

## 2023-09-03 DIAGNOSIS — E039 Hypothyroidism, unspecified: Secondary | ICD-10-CM | POA: Diagnosis not present

## 2023-09-03 DIAGNOSIS — E669 Obesity, unspecified: Secondary | ICD-10-CM

## 2023-09-03 DIAGNOSIS — O0943 Supervision of pregnancy with grand multiparity, third trimester: Secondary | ICD-10-CM | POA: Diagnosis not present

## 2023-09-03 DIAGNOSIS — Z3A35 35 weeks gestation of pregnancy: Secondary | ICD-10-CM

## 2023-09-03 DIAGNOSIS — O3663X0 Maternal care for excessive fetal growth, third trimester, not applicable or unspecified: Secondary | ICD-10-CM

## 2023-09-10 ENCOUNTER — Ambulatory Visit: Payer: Medicaid Other

## 2023-09-11 ENCOUNTER — Other Ambulatory Visit (HOSPITAL_COMMUNITY)
Admission: RE | Admit: 2023-09-11 | Discharge: 2023-09-11 | Disposition: A | Discharge status: Home / Self Care | Payer: Medicaid Other | Source: Ambulatory Visit | Attending: Obstetrics and Gynecology | Admitting: Obstetrics and Gynecology

## 2023-09-11 ENCOUNTER — Ambulatory Visit (INDEPENDENT_AMBULATORY_CARE_PROVIDER_SITE_OTHER): Payer: Medicaid Other | Admitting: Obstetrics and Gynecology

## 2023-09-11 VITALS — BP 121/75 | HR 91 | Wt 244.4 lb

## 2023-09-11 DIAGNOSIS — Z3A36 36 weeks gestation of pregnancy: Secondary | ICD-10-CM | POA: Insufficient documentation

## 2023-09-11 DIAGNOSIS — Z758 Other problems related to medical facilities and other health care: Secondary | ICD-10-CM

## 2023-09-11 DIAGNOSIS — Z603 Acculturation difficulty: Secondary | ICD-10-CM

## 2023-09-11 DIAGNOSIS — Z3480 Encounter for supervision of other normal pregnancy, unspecified trimester: Secondary | ICD-10-CM | POA: Insufficient documentation

## 2023-09-11 DIAGNOSIS — O99280 Endocrine, nutritional and metabolic diseases complicating pregnancy, unspecified trimester: Secondary | ICD-10-CM

## 2023-09-11 DIAGNOSIS — O3662X0 Maternal care for excessive fetal growth, second trimester, not applicable or unspecified: Secondary | ICD-10-CM

## 2023-09-11 DIAGNOSIS — E039 Hypothyroidism, unspecified: Secondary | ICD-10-CM

## 2023-09-11 NOTE — Progress Notes (Signed)
   PRENATAL VISIT NOTE  Subjective:  Tonya [name] is a 38 y.o. G7P6006 at [redacted]w[redacted]d being seen today for ongoing prenatal care.  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  Patient reports no complaints.  Contractions: Irritability (at night). Vag. Bleeding: None.  Movement: Present. Denies leaking of fluid.   The following portions of the patient's history were reviewed and updated as appropriate: allergies, current medications, past family history, past medical history, past social history, past surgical history and problem list.   Objective:   Vitals:   09/11/23 1635  BP: 121/75  Pulse: 91  Weight: 244 lb 6.4 oz (110.9 kg)    Fetal Status: Fetal Heart Rate (bpm): 152   Movement: Present     General:  Alert, oriented and cooperative. Patient is in no acute distress.  Skin: Skin is warm and dry. No rash noted.   Cardiovascular: Normal heart rate noted  Respiratory: Normal respiratory effort, no problems with respiration noted  Abdomen: Soft, gravid, appropriate for gestational age.  Pain/Pressure: Present     Pelvic: Cervical exam deferred        Extremities: Normal range of motion.  Edema: None  Mental Status: Normal mood and affect. Normal behavior. Normal judgment and thought content.   Assessment and Plan:  Pregnancy: G7P6006 at [redacted]w[redacted]d  1. Supervision of other normal pregnancy, antepartum (Primary) BP and FHR normal Doing well, feeling regular movement    2. Hypothyroid in pregnancy,  antepartum Continue 75 mcg synthroid  3. Excessive fetal growth affecting management of pregnancy in second trimester, single or unspecified fetus 1/8 u/ efw 98%, AC 93%, afi normal, bpp 8/8  Continue weekly antenatal testing  4. Language barrier Ipad interpreter at bedside   5. [redacted] weeks gestation of pregnancy Swabs collected today   Preterm labor symptoms and general obstetric precautions including but not limited to vaginal bleeding, contractions, leaking of fluid and fetal movement were reviewed in detail with the patient. Please refer to After Visit Summary for other counseling recommendations.   Return in one week for routine prenatal   Future Appointments  Date Time Provider Department Center  09/17/2023  2:30 PM [facility] US[facility]  09/17/2023  4:10 PM Sue Lush, FNP [facility] None  09/24/2023  1:30 PM [facility] US2 [facility]  09/24/2023  4:10 PM Sue Lush, FNP [facility] None  10/01/2023  2:30 PM [facility] US3 [facility] [place]  10/01/2023  4:10 PM Sue Lush, FNP [facility] None  10/08/2023  3:50 PM Sue Lush, FNP [facility] None    Albertine Grates, FNP

## 2023-09-14 LAB — CULTURE, BETA STREP (GROUP B ONLY): Strep Gp B Culture: POSITIVE — AB

## 2023-09-15 ENCOUNTER — Encounter: Payer: Self-pay | Admitting: Obstetrics and Gynecology

## 2023-09-15 DIAGNOSIS — O99820 Streptococcus B carrier state complicating pregnancy: Secondary | ICD-10-CM | POA: Insufficient documentation

## 2023-09-15 LAB — CERVICOVAGINAL ANCILLARY ONLY
Chlamydia: NEGATIVE
Comment: NEGATIVE
Comment: NORMAL
Neisseria Gonorrhea: NEGATIVE

## 2023-09-17 ENCOUNTER — Encounter: Payer: Medicaid Other | Admitting: Obstetrics and Gynecology

## 2023-09-17 ENCOUNTER — Ambulatory Visit: Payer: Medicaid Other

## 2023-09-24 ENCOUNTER — Other Ambulatory Visit: Payer: Self-pay | Admitting: Advanced Practice Midwife

## 2023-09-24 ENCOUNTER — Other Ambulatory Visit: Payer: Self-pay

## 2023-09-24 ENCOUNTER — Ambulatory Visit (INDEPENDENT_AMBULATORY_CARE_PROVIDER_SITE_OTHER): Payer: Medicaid Other | Admitting: Nurse Practitioner

## 2023-09-24 ENCOUNTER — Ambulatory Visit: Payer: Medicaid Other | Attending: Obstetrics and Gynecology

## 2023-09-24 VITALS — BP 124/81 | HR 88 | Wt 238.7 lb

## 2023-09-24 DIAGNOSIS — E039 Hypothyroidism, unspecified: Secondary | ICD-10-CM

## 2023-09-24 DIAGNOSIS — O3662X0 Maternal care for excessive fetal growth, second trimester, not applicable or unspecified: Secondary | ICD-10-CM

## 2023-09-24 DIAGNOSIS — O99213 Obesity complicating pregnancy, third trimester: Secondary | ICD-10-CM

## 2023-09-24 DIAGNOSIS — O3663X0 Maternal care for excessive fetal growth, third trimester, not applicable or unspecified: Secondary | ICD-10-CM | POA: Insufficient documentation

## 2023-09-24 DIAGNOSIS — Z3A38 38 weeks gestation of pregnancy: Secondary | ICD-10-CM

## 2023-09-24 DIAGNOSIS — O0943 Supervision of pregnancy with grand multiparity, third trimester: Secondary | ICD-10-CM

## 2023-09-24 DIAGNOSIS — O99280 Endocrine, nutritional and metabolic diseases complicating pregnancy, unspecified trimester: Secondary | ICD-10-CM

## 2023-09-24 DIAGNOSIS — O99820 Streptococcus B carrier state complicating pregnancy: Secondary | ICD-10-CM

## 2023-09-24 DIAGNOSIS — O99283 Endocrine, nutritional and metabolic diseases complicating pregnancy, third trimester: Secondary | ICD-10-CM | POA: Diagnosis not present

## 2023-09-24 DIAGNOSIS — E669 Obesity, unspecified: Secondary | ICD-10-CM | POA: Diagnosis not present

## 2023-09-24 DIAGNOSIS — Z603 Acculturation difficulty: Secondary | ICD-10-CM

## 2023-09-24 DIAGNOSIS — O09523 Supervision of elderly multigravida, third trimester: Secondary | ICD-10-CM | POA: Insufficient documentation

## 2023-09-24 DIAGNOSIS — O09899 Supervision of other high risk pregnancies, unspecified trimester: Secondary | ICD-10-CM

## 2023-09-24 DIAGNOSIS — Z3480 Encounter for supervision of other normal pregnancy, unspecified trimester: Secondary | ICD-10-CM

## 2023-09-24 DIAGNOSIS — Z758 Other problems related to medical facilities and other health care: Secondary | ICD-10-CM

## 2023-09-24 MED ORDER — PANTOPRAZOLE SODIUM 40 MG PO TBEC: 40.0000 mg | DELAYED_RELEASE_TABLET | Freq: Every day | ORAL | 3 refills | Status: AC

## 2023-09-24 NOTE — Progress Notes (Signed)
   PRENATAL VISIT NOTE  Subjective:  Tonya [name] is a 38 y.o. G7P6006 at [redacted]w[redacted]d being seen today for ongoing prenatal care.  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  Patient reports no complaints.  Contractions: Irregular. Vag. Bleeding: None.  Movement: Present. Denies leaking of fluid.   The following portions of the patient's history were reviewed and updated as appropriate: allergies, current medications, past family history, past medical history, past social history, past surgical history and problem list.   Objective:   Vitals:   09/24/23 1628  BP: 124/81  Pulse: 88  Weight: 238 lb 11.2 oz (108.3 kg)    Fetal Status: Fetal Heart Rate (bpm): 140   Movement: Present     General:  Alert, oriented and cooperative. Patient is in no acute distress.  Skin: Skin is warm and dry. No rash noted.   Cardiovascular: Normal heart rate noted  Respiratory: Normal respiratory effort, no problems with respiration noted  Abdomen: Soft, gravid, appropriate for gestational age.  Pain/Pressure: Present     Pelvic: Cervical exam deferred        Extremities: Normal range of motion.  Edema: None  Mental Status: Normal mood and affect. Normal behavior. Normal judgment and thought content.   Assessment and Plan:  Pregnancy: G7P6006 at [redacted]w[redacted]d 1. Supervision of other normal pregnancy, antepartum (Primary) BP and FHR normal Doing well, feeling regular  movement   2. Hypothyroid in pregnancy, antepartum On synthroid 75 mcg  3. Excessive fetal growth affecting management of pregnancy in second trimester, single or unspecified fetus 1/29  Presentation cephalic BPP 8/8 1/8 afi normal, EFW 98%, AC 93%  4. Language barrier In person interpreter present    7. Morbid obesity (HCC) 5. [redacted] weeks gestation of pregnancy Discussed hx with MD in office, would recommend 39 week IOL scheduled for 09/29/23 AM  [place] awaiting phone call to go to the hospital Labor precautions   8. Group B Streptococcus carrier, +RV culture, currently pregnant Tx in labor   Term labor symptoms and general obstetric precautions including but not limited to vaginal bleeding, contractions, leaking of fluid and fetal movement were reviewed in detail with the patient. Please refer to After Visit Summary for other counseling recommendations.   Return postpartum   Albertine Grates, FNP

## 2023-09-24 NOTE — Progress Notes (Signed)
Needs new RX for Protonix.

## 2023-09-29 ENCOUNTER — Telehealth: Payer: Self-pay | Admitting: Obstetrics & Gynecology

## 2023-09-29 ENCOUNTER — Inpatient Hospital Stay (HOSPITAL_COMMUNITY): Payer: Medicaid Other

## 2023-09-29 NOTE — Progress Notes (Signed)
Pt called to come in for scheduled induction today at 1705.  Pt declines and would like to reschedule for Thursday.  Pt encouraged to contact her provider to discuss rescheduling her induction.

## 2023-09-29 NOTE — Telephone Encounter (Signed)
     Faculty Practice OB/GYN Physician Phone Call Documentation  I attempted to place a phone conversation with [facility] with the help of a Somali interpreter about her deferring her scheduled IOL today 2/3 until 2/6.  Voicemail was obtained, message was left with patient with instructions to call the [facility] office back with her reasons for wanting the deferral.  She should be told this is being done due to concern about excessive fetal growth, and this does increase daily and can increase risk of complications associated with this factor.  For now, her IOL date was moved to 2/6 as requested.  This can be communicated with her.  [facility] nursing staff notifed.    Jaynie Collins, MD, FACOG Obstetrician & Gynecologist, [facility] for [employer], [facility]

## 2023-09-30 ENCOUNTER — Telehealth (HOSPITAL_COMMUNITY): Payer: Self-pay | Admitting: *Deleted

## 2023-09-30 ENCOUNTER — Encounter (HOSPITAL_COMMUNITY): Payer: Self-pay

## 2023-09-30 ENCOUNTER — Other Ambulatory Visit: Payer: Self-pay | Admitting: Advanced Practice Midwife

## 2023-09-30 NOTE — Telephone Encounter (Signed)
Preadmission screen Interpreter number [phone]

## 2023-09-30 NOTE — Telephone Encounter (Signed)
Patient informed of date change for IOL via Somali interpreter.

## 2023-10-01 ENCOUNTER — Encounter: Payer: Medicaid Other | Admitting: Obstetrics and Gynecology

## 2023-10-01 ENCOUNTER — Ambulatory Visit: Payer: Medicaid Other | Attending: Obstetrics and Gynecology

## 2023-10-01 ENCOUNTER — Telehealth (HOSPITAL_COMMUNITY): Payer: Self-pay | Admitting: *Deleted

## 2023-10-01 NOTE — Telephone Encounter (Signed)
Interpreter number [phone]

## 2023-10-02 ENCOUNTER — Other Ambulatory Visit: Payer: Self-pay | Admitting: Advanced Practice Midwife

## 2023-10-02 ENCOUNTER — Inpatient Hospital Stay (HOSPITAL_COMMUNITY): Payer: Medicaid Other

## 2023-10-02 ENCOUNTER — Encounter (HOSPITAL_COMMUNITY): Payer: Self-pay | Admitting: Obstetrics & Gynecology

## 2023-10-02 ENCOUNTER — Other Ambulatory Visit: Payer: Self-pay

## 2023-10-02 ENCOUNTER — Inpatient Hospital Stay (HOSPITAL_COMMUNITY)
Admission: AD | Admit: 2023-10-02 | Discharge: 2023-10-04 | DRG: 807 | Disposition: A | Discharge status: Home / Self Care | Payer: Medicaid Other | Attending: Obstetrics and Gynecology | Admitting: Obstetrics and Gynecology

## 2023-10-02 DIAGNOSIS — O9962 Diseases of the digestive system complicating childbirth: Secondary | ICD-10-CM | POA: Diagnosis present

## 2023-10-02 DIAGNOSIS — Z79890 Hormone replacement therapy (postmenopausal): Secondary | ICD-10-CM

## 2023-10-02 DIAGNOSIS — Z3480 Encounter for supervision of other normal pregnancy, unspecified trimester: Secondary | ICD-10-CM

## 2023-10-02 DIAGNOSIS — O3483 Maternal care for other abnormalities of pelvic organs, third trimester: Secondary | ICD-10-CM | POA: Diagnosis present

## 2023-10-02 DIAGNOSIS — O99820 Streptococcus B carrier state complicating pregnancy: Secondary | ICD-10-CM | POA: Diagnosis not present

## 2023-10-02 DIAGNOSIS — O3463 Maternal care for abnormality of vagina, third trimester: Secondary | ICD-10-CM | POA: Diagnosis not present

## 2023-10-02 DIAGNOSIS — O99284 Endocrine, nutritional and metabolic diseases complicating childbirth: Secondary | ICD-10-CM | POA: Diagnosis present

## 2023-10-02 DIAGNOSIS — O99214 Obesity complicating childbirth: Secondary | ICD-10-CM | POA: Diagnosis present

## 2023-10-02 DIAGNOSIS — K219 Gastro-esophageal reflux disease without esophagitis: Secondary | ICD-10-CM | POA: Diagnosis present

## 2023-10-02 DIAGNOSIS — N90810 Female genital mutilation status, unspecified: Secondary | ICD-10-CM | POA: Diagnosis present

## 2023-10-02 DIAGNOSIS — E66813 Obesity, class 3: Secondary | ICD-10-CM | POA: Diagnosis present

## 2023-10-02 DIAGNOSIS — Z560 Unemployment, unspecified: Secondary | ICD-10-CM

## 2023-10-02 DIAGNOSIS — E039 Hypothyroidism, unspecified: Secondary | ICD-10-CM | POA: Diagnosis present

## 2023-10-02 DIAGNOSIS — O99824 Streptococcus B carrier state complicating childbirth: Secondary | ICD-10-CM | POA: Diagnosis present

## 2023-10-02 DIAGNOSIS — O09523 Supervision of elderly multigravida, third trimester: Secondary | ICD-10-CM | POA: Diagnosis not present

## 2023-10-02 DIAGNOSIS — O3663X0 Maternal care for excessive fetal growth, third trimester, not applicable or unspecified: Secondary | ICD-10-CM | POA: Diagnosis present

## 2023-10-02 DIAGNOSIS — O322XX0 Maternal care for transverse and oblique lie, not applicable or unspecified: Secondary | ICD-10-CM | POA: Diagnosis present

## 2023-10-02 DIAGNOSIS — Z3A39 39 weeks gestation of pregnancy: Secondary | ICD-10-CM | POA: Diagnosis not present

## 2023-10-02 LAB — CBC
HCT: 31.9 % — ABNORMAL LOW (ref 36.0–46.0)
Hemoglobin: 10.4 g/dL — ABNORMAL LOW (ref 12.0–15.0)
MCH: 25.9 pg — ABNORMAL LOW (ref 26.0–34.0)
MCHC: 32.6 g/dL (ref 30.0–36.0)
MCV: 79.6 fL — ABNORMAL LOW (ref 80.0–100.0)
Platelets: 227 10*3/uL (ref 150–400)
RBC: 4.01 MIL/uL (ref 3.87–5.11)
RDW: 15.1 % (ref 11.5–15.5)
WBC: 5.1 10*3/uL (ref 4.0–10.5)
nRBC: 0 % (ref 0.0–0.2)

## 2023-10-02 LAB — TYPE AND SCREEN
ABO/RH(D): B POS
Antibody Screen: NEGATIVE

## 2023-10-02 MED ORDER — MISOPROSTOL 25 MCG QUARTER TABLET
25.0000 ug | ORAL_TABLET | Freq: Once | ORAL | Status: AC
  Administered 2023-10-02: 25 ug via VAGINAL
  Filled 2023-10-02: qty 1

## 2023-10-02 MED ORDER — TERBUTALINE SULFATE 1 MG/ML IJ SOLN: 0.2500 mg | Freq: Once | INTRAMUSCULAR | Status: DC | PRN

## 2023-10-02 MED ORDER — SODIUM CHLORIDE 0.9 % IV SOLN
5.0000 10*6.[IU] | Freq: Once | INTRAVENOUS | Status: AC
  Administered 2023-10-02: 5 10*6.[IU] via INTRAVENOUS
  Filled 2023-10-02: qty 5

## 2023-10-02 MED ORDER — LEVOTHYROXINE SODIUM 75 MCG PO TABS
75.0000 ug | ORAL_TABLET | Freq: Every day | ORAL | Status: DC
  Administered 2023-10-03: 75 ug via ORAL
  Filled 2023-10-02: qty 1

## 2023-10-02 MED ORDER — LIDOCAINE HCL (PF) 1 % IJ SOLN: 30.0000 mL | INTRAMUSCULAR | Status: DC | PRN

## 2023-10-02 MED ORDER — OXYTOCIN BOLUS FROM INFUSION
333.0000 mL | Freq: Once | INTRAVENOUS | Status: AC
  Administered 2023-10-03: 333 mL via INTRAVENOUS

## 2023-10-02 MED ORDER — ONDANSETRON HCL 4 MG/2ML IJ SOLN: 4.0000 mg | Freq: Four times a day (QID) | INTRAMUSCULAR | Status: DC | PRN

## 2023-10-02 MED ORDER — SOD CITRATE-CITRIC ACID 500-334 MG/5ML PO SOLN
30.0000 mL | ORAL | Status: DC | PRN
  Administered 2023-10-03: 30 mL via ORAL
  Filled 2023-10-02: qty 30

## 2023-10-02 MED ORDER — MISOPROSTOL 50MCG HALF TABLET
50.0000 ug | ORAL_TABLET | Freq: Once | ORAL | Status: AC
  Administered 2023-10-02: 50 ug via ORAL
  Filled 2023-10-02: qty 1

## 2023-10-02 MED ORDER — PENICILLIN G POT IN DEXTROSE 60000 UNIT/ML IV SOLN
3.0000 10*6.[IU] | INTRAVENOUS | Status: DC
  Administered 2023-10-02 – 2023-10-03 (×3): 3 10*6.[IU] via INTRAVENOUS
  Filled 2023-10-02 (×3): qty 50

## 2023-10-02 MED ORDER — FENTANYL CITRATE (PF) 100 MCG/2ML IJ SOLN: 50.0000 ug | INTRAMUSCULAR | Status: DC | PRN

## 2023-10-02 MED ORDER — LACTATED RINGERS IV SOLN
500.0000 mL | INTRAVENOUS | Status: DC | PRN
Start: 2023-10-02 — End: 2023-10-03

## 2023-10-02 MED ORDER — OXYTOCIN-SODIUM CHLORIDE 30-0.9 UT/500ML-% IV SOLN
2.5000 [IU]/h | INTRAVENOUS | Status: DC
  Administered 2023-10-03: 2.5 [IU]/h via INTRAVENOUS
  Filled 2023-10-02: qty 500

## 2023-10-02 MED ORDER — ACETAMINOPHEN 325 MG PO TABS: 650.0000 mg | ORAL_TABLET | ORAL | Status: DC | PRN

## 2023-10-02 MED ORDER — LACTATED RINGERS IV SOLN: INTRAVENOUS | Status: DC

## 2023-10-02 MED ORDER — TRANEXAMIC ACID-NACL 1000-0.7 MG/100ML-% IV SOLN
1000.0000 mg | Freq: Once | INTRAVENOUS | Status: AC
  Administered 2023-10-03: 1000 mg via INTRAVENOUS
  Filled 2023-10-02: qty 100

## 2023-10-02 NOTE — H&P (Addendum)
 OBSTETRIC ADMISSION HISTORY AND PHYSICAL  Tonya [name] is a 38 y.o. female [phone] with IUP at [redacted]w[redacted]d (dated by LMP/c/w 22 wk, Estimated Date of Delivery: 10/06/23) presenting for IOL for BMI of 44.   She reports +FMs, No LOF, no VB, no blurry vision, headaches or peripheral edema, and RUQ pain.    She plans on breast feeding. She declined birth control.  [facility]   Prenatal History/Complications:  - Hypothyroidism - BMI 44 - Hx female circ - AMA  Past Medical History: Past Medical History:  Diagnosis Date   Medical history non-contributory     Past Surgical History: Past Surgical History:  Procedure Laterality Date   NO PAST SURGERIES      Obstetrical History: OB History     Gravida  7   Para  6   Term  6   Preterm      AB      Living  6      SAB      IAB      Ectopic      Multiple  0   Live Births  6           Social History Social History   Socioeconomic History   Marital status: Married    Spouse name: Not on file   Number of children: 6   Years of education: Not on file   Highest education level: Not on file  Occupational History   Occupation: Unemployed  Tobacco Use   Smoking status: Never   Smokeless tobacco: Never  Vaping Use   Vaping status: Never Used  Substance and Sexual Activity   Alcohol use: No   Drug use: No   Sexual activity: Not Currently    Birth control/protection: None  Other Topics Concern   Not on file  Social History Narrative   Not on file   Social Drivers of Health   Financial Resource Strain: Not on file  Food Insecurity: No Food Insecurity (10/02/2023)   Hunger Vital Sign    Worried About Running Out of Food in the Last Year: Never true    Ran Out of Food in the Last Year: Never true  Transportation Needs: No Transportation Needs (10/02/2023)   PRAPARE - Administrator, Civil Service (Medical): No    Lack of Transportation (Non-Medical): No  Physical  Activity: Not on file  Stress: Not on file  Social Connections: Patient Declined (10/02/2023)   Social Connection and Isolation Panel [NHANES]    Frequency of Communication with Friends and Family: Patient declined    Frequency of Social Gatherings with Friends and Family: Patient declined    Attends Religious Services: Patient declined    Database Administrator or Organizations: Patient declined    Attends Engineer, Structural: Patient declined    Marital Status: Patient declined    Family History: Family History  Problem Relation Age of Onset   Cancer Mother    Alcohol abuse Neg Hx    Arthritis Neg Hx    Asthma Neg Hx    Birth defects Neg Hx    COPD Neg Hx    Depression Neg Hx    Diabetes Neg Hx    Drug abuse Neg Hx    Early death Neg Hx    Hearing loss Neg Hx    Heart disease Neg Hx    Hyperlipidemia Neg Hx    Hypertension Neg Hx    Kidney  disease Neg Hx    Learning disabilities Neg Hx    Mental illness Neg Hx    Mental retardation Neg Hx    Miscarriages / Stillbirths Neg Hx    Stroke Neg Hx    Vision loss Neg Hx    Varicose Veins Neg Hx     Allergies: Allergies  Allergen Reactions   Pork-Derived Products Other (See Comments)    Cultural preference     Medications Prior to Admission  Medication Sig Dispense Refill Last Dose/Taking   acetaminophen  (TYLENOL ) 325 MG tablet Take 2 tablets (650 mg total) by mouth every 4 (four) hours as needed for mild pain (for pain scale < 4). 30 tablet 0 10/01/2023   ferrous gluconate  (FERGON) 324 MG tablet Take 1 tablet (324 mg total) by mouth daily with breakfast. 30 tablet 3 10/02/2023   levothyroxine  (SYNTHROID ) 75 MCG tablet Take 1 tablet (75 mcg total) by mouth daily before breakfast. 60 tablet 4 10/02/2023   pantoprazole  (PROTONIX ) 40 MG tablet Take 1 tablet (40 mg total) by mouth daily. 90 tablet 3 10/02/2023   Prenatal Vit-Fe Fumarate-FA (MULTIVITAMIN-PRENATAL) 27-0.8 MG TABS tablet Take 1 tablet by mouth daily at 12  noon.   10/02/2023   aspirin  EC 81 MG tablet Take 1 tablet (81 mg total) by mouth daily. Start taking when you are [redacted] weeks pregnant for rest of pregnancy for prevention of preeclampsia (Patient not taking: Reported on 08/13/2023) 300 tablet 2    ibuprofen  (ADVIL ) 600 MG tablet Take 1 tablet (600 mg total) by mouth every 6 (six) hours. (Patient not taking: Reported on 12/27/2019) 30 tablet 0 Not Taking     Review of Systems  All systems reviewed and negative except as stated in HPI.  Blood pressure 131/80, pulse 95, temperature 98.7 F (37.1 C), temperature source Oral, resp. rate 18, height 5' 2 (1.575 m), weight 109 kg, last menstrual period 12/30/2022, unknown if currently breastfeeding. General appearance: alert and cooperative Lungs: breathing comfortably on room air Heart: regular rate Abdomen: soft, non-tender; gravid Extremities: no edema of bilateral lower extremities Presentation: cephalic on BSUS Fetal monitoring: 135/mod/+a/-d Uterine activity: None Dilation: 1 Effacement (%): Thick Station: Ballotable Exam by:: Dr, Von   Prenatal labs: ABO, Rh: --/--/B POS (02/06 1650) Antibody: NEG (02/06 1650) Rubella: 1.34 (08/22 1226) RPR: Non Reactive (11/14 0826)  HBsAg: Negative (08/22 1226)  HIV: Non Reactive (11/14 0826)  GBS: Positive/-- (01/16 1654)  2 hr Glucola wnl Genetic screening  LR female Anatomy US  normal except placental lakes Last US : At [redacted]w[redacted]d - cephalic presentation, EFW 3307 (98 %tile), AC 93%tile  Prenatal Transfer Tool  Maternal Diabetes: No Genetic Screening: Normal Maternal Ultrasounds/Referrals: Normal Fetal Ultrasounds or other Referrals:  None Maternal Substance Abuse:  No Significant Maternal Medications:  Meds include: Syntroid Significant Maternal Lab Results:  Group B Strep positive Number of Prenatal Visits:greater than 3 verified prenatal visits Other Comments:  None  Results for orders placed or performed during the hospital encounter  of 10/02/23 (from the past 24 hours)  Type and screen   Collection Time: 10/02/23  4:50 PM  Result Value Ref Range   ABO/RH(D) B POS    Antibody Screen NEG    Sample Expiration      10/05/2023,2359 Performed at [facility], [address] [place], [place] [address]   CBC   Collection Time: 10/02/23  4:51 PM  Result Value Ref Range   WBC 5.1 4.0 - 10.5 K/uL   RBC 4.01  3.87 - 5.11 MIL/uL   Hemoglobin 10.4 (L) 12.0 - 15.0 g/dL   HCT 68.0 (L) 63.9 - 53.9 %   MCV 79.6 (L) 80.0 - 100.0 fL   MCH 25.9 (L) 26.0 - 34.0 pg   MCHC 32.6 30.0 - 36.0 g/dL   RDW 84.8 88.4 - 84.4 %   Platelets 227 150 - 400 K/uL   nRBC 0.0 0.0 - 0.2 %    Patient Active Problem List   Diagnosis Date Noted   Morbid obesity (HCC) 10/02/2023   Group B Streptococcus carrier, +RV culture, currently pregnant 09/15/2023   Large for gestational age fetus affecting management of mother, antepartum 08/29/2023   Grand multiparity with antenatal problem in third trimester 08/29/2023   Obesity affecting pregnancy 05/12/2023   Advanced maternal age in multigravida 04/17/2023   Hypothyroid in pregnancy, antepartum 04/17/2023   Supervision of other high risk pregnancy, antepartum, unspecified trimester 04/17/2023   Supervision of other normal pregnancy, antepartum 07/05/2019   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 G7P6006 at [redacted]w[redacted]d here for IOL due to LGA and BMI 44  #Labor:Discussed option for Cytotec  vs FB  pt opted for dual cytotec  at this time, recheck in 4 hours #Pain: Epidural #FWB: Cat I #ID:  GBS positive, on PCN #MOF: Breast #MOC: Declined #Circ:  N/A  #LGA: EFW as above  discussed shoulder dystocia risk w pt and her sister  GTT wnl  pelvis proven to 8# per pt report  #Hypothyroidism: Cont home Synthroid   #BMI 44  #Grand multip: TXA at del  #Female circ  #AMA  Alain Sor, MD OB Fellow, Faculty  Practice [facility], Center for [facility] 10/02/23 7:05 PM

## 2023-10-02 NOTE — Progress Notes (Signed)
 Labor Progress Note  Tonya [name] is a 38 y.o. G7P6006 at [redacted]w[redacted]d presented for IOL 2/2 LGA and BMI 44  S: pt resting comfortably in bed, agreeable with female provider exam and FB placement at this time.   O:  BP 127/68   Pulse 93   Temp 98.6 F (37 C) (Oral)   Resp 16   Ht 5' 2 (1.575 m)   Wt 109 kg   LMP 12/30/2022   BMI 43.97 kg/m  EFM:135bpm/Moderate variability/ 15x15 accels/ None decels CAT: 1 Toco: irregular, every 3-5 minutes   CVE: Dilation: 3 Effacement (%): 50 Station: -2 Presentation: Vertex Exam by:: Dr Jhonny   A&P: 38 y.o. H2E3993 [redacted]w[redacted]d  here for IOL as above  #Labor: Progressing well. Cook 60cc/0cc placement, mom tolerated well no concerns.  Will add low dose pitocin  on top with cap of 8U.  #Pain: per patient request #FWB: CAT 1 #GBS positive, PCN (receiving 2nd dose while writer in room)  #LGA: @[redacted]w[redacted]d  - cephalic presentation, EFW 3307 (98 %tile), AC 93%tile   discussed shoulder dystocia risk w pt and her sister  GTT wnl  pelvis proven to 8# per pt report   #Hypothyroidism: Cont home Synthroid    #BMI 44   #Grand multip: TXA at del   #Female circ   #AMA  Augustin JAYSON Jhonny, MD FMOB Fellow, Faculty practice [facility], Center for [facility] 10/02/23  11:19 PM

## 2023-10-02 NOTE — Plan of Care (Signed)

## 2023-10-03 ENCOUNTER — Inpatient Hospital Stay (HOSPITAL_COMMUNITY): Payer: Medicaid Other | Admitting: Anesthesiology

## 2023-10-03 ENCOUNTER — Encounter (HOSPITAL_COMMUNITY): Payer: Self-pay | Admitting: Obstetrics & Gynecology

## 2023-10-03 DIAGNOSIS — O99284 Endocrine, nutritional and metabolic diseases complicating childbirth: Secondary | ICD-10-CM

## 2023-10-03 DIAGNOSIS — O09523 Supervision of elderly multigravida, third trimester: Secondary | ICD-10-CM

## 2023-10-03 DIAGNOSIS — Z3A39 39 weeks gestation of pregnancy: Secondary | ICD-10-CM

## 2023-10-03 DIAGNOSIS — O3663X0 Maternal care for excessive fetal growth, third trimester, not applicable or unspecified: Secondary | ICD-10-CM

## 2023-10-03 DIAGNOSIS — O99820 Streptococcus B carrier state complicating pregnancy: Secondary | ICD-10-CM

## 2023-10-03 DIAGNOSIS — O99214 Obesity complicating childbirth: Secondary | ICD-10-CM

## 2023-10-03 DIAGNOSIS — O3463 Maternal care for abnormality of vagina, third trimester: Secondary | ICD-10-CM

## 2023-10-03 LAB — RPR: RPR Ser Ql: NONREACTIVE

## 2023-10-03 MED ORDER — PHENYLEPHRINE 80 MCG/ML (10ML) SYRINGE FOR IV PUSH (FOR BLOOD PRESSURE SUPPORT): 80.0000 ug | PREFILLED_SYRINGE | INTRAVENOUS | Status: DC | PRN

## 2023-10-03 MED ORDER — IBUPROFEN 600 MG PO TABS
600.0000 mg | ORAL_TABLET | Freq: Four times a day (QID) | ORAL | Status: DC
  Administered 2023-10-03 – 2023-10-04 (×4): 600 mg via ORAL
  Filled 2023-10-03 (×4): qty 1

## 2023-10-03 MED ORDER — FENTANYL-BUPIVACAINE-NACL 0.5-0.125-0.9 MG/250ML-% EP SOLN
12.0000 mL/h | EPIDURAL | Status: DC | PRN
  Administered 2023-10-03: 11 mL/h via EPIDURAL
  Filled 2023-10-03: qty 250

## 2023-10-03 MED ORDER — TERBUTALINE SULFATE 1 MG/ML IJ SOLN: 0.2500 mg | Freq: Once | INTRAMUSCULAR | Status: DC | PRN

## 2023-10-03 MED ORDER — ONDANSETRON HCL 4 MG PO TABS: 4.0000 mg | ORAL_TABLET | ORAL | Status: DC | PRN

## 2023-10-03 MED ORDER — OXYCODONE HCL 5 MG PO TABS: 5.0000 mg | ORAL_TABLET | ORAL | Status: DC | PRN

## 2023-10-03 MED ORDER — SENNOSIDES-DOCUSATE SODIUM 8.6-50 MG PO TABS
2.0000 | ORAL_TABLET | Freq: Every day | ORAL | Status: DC
  Administered 2023-10-04: 2 via ORAL
  Filled 2023-10-03: qty 2

## 2023-10-03 MED ORDER — WITCH HAZEL-GLYCERIN EX PADS: 1.0000 | MEDICATED_PAD | CUTANEOUS | Status: DC | PRN

## 2023-10-03 MED ORDER — DIBUCAINE (PERIANAL) 1 % EX OINT: 1.0000 | TOPICAL_OINTMENT | CUTANEOUS | Status: DC | PRN

## 2023-10-03 MED ORDER — EPHEDRINE 5 MG/ML INJ: 10.0000 mg | INTRAVENOUS | Status: DC | PRN

## 2023-10-03 MED ORDER — DIPHENHYDRAMINE HCL 50 MG/ML IJ SOLN: 12.5000 mg | INTRAMUSCULAR | Status: DC | PRN

## 2023-10-03 MED ORDER — BENZOCAINE-MENTHOL 20-0.5 % EX AERO: 1.0000 | INHALATION_SPRAY | CUTANEOUS | Status: DC | PRN

## 2023-10-03 MED ORDER — OXYTOCIN-SODIUM CHLORIDE 30-0.9 UT/500ML-% IV SOLN
1.0000 m[IU]/min | INTRAVENOUS | Status: DC
  Administered 2023-10-03: 2 m[IU]/min via INTRAVENOUS

## 2023-10-03 MED ORDER — SIMETHICONE 80 MG PO CHEW: 80.0000 mg | CHEWABLE_TABLET | ORAL | Status: DC | PRN

## 2023-10-03 MED ORDER — DIPHENHYDRAMINE HCL 25 MG PO CAPS: 25.0000 mg | ORAL_CAPSULE | Freq: Four times a day (QID) | ORAL | Status: DC | PRN

## 2023-10-03 MED ORDER — COCONUT OIL OIL: 1.0000 | TOPICAL_OIL | Status: DC | PRN

## 2023-10-03 MED ORDER — PRENATAL MULTIVITAMIN CH
1.0000 | ORAL_TABLET | Freq: Every day | ORAL | Status: DC
  Administered 2023-10-03 – 2023-10-04 (×2): 1 via ORAL
  Filled 2023-10-03 (×2): qty 1

## 2023-10-03 MED ORDER — OXYCODONE HCL 5 MG PO TABS: 10.0000 mg | ORAL_TABLET | ORAL | Status: DC | PRN

## 2023-10-03 MED ORDER — ACETAMINOPHEN 325 MG PO TABS
650.0000 mg | ORAL_TABLET | ORAL | Status: DC | PRN
  Administered 2023-10-03 – 2023-10-04 (×2): 650 mg via ORAL
  Filled 2023-10-03 (×2): qty 2

## 2023-10-03 MED ORDER — LACTATED RINGERS IV SOLN
500.0000 mL | Freq: Once | INTRAVENOUS | Status: AC
Start: 2023-10-03 — End: 2023-10-03
  Administered 2023-10-03: 500 mL via INTRAVENOUS

## 2023-10-03 MED ORDER — LIDOCAINE HCL (PF) 1 % IJ SOLN
INTRAMUSCULAR | Status: DC | PRN
  Administered 2023-10-03 (×2): 4 mL via EPIDURAL

## 2023-10-03 MED ORDER — ONDANSETRON HCL 4 MG/2ML IJ SOLN
4.0000 mg | INTRAMUSCULAR | Status: DC | PRN
Start: 2023-10-03 — End: 2023-10-04

## 2023-10-03 NOTE — Anesthesia Procedure Notes (Signed)
 Epidural Patient location during procedure: OB Start time: 10/03/2023 12:54 AM End time: 10/03/2023 1:03 AM  Staffing Anesthesiologist: Jerrye Sharper, MD Performed: anesthesiologist   Preanesthetic Checklist Completed: patient identified, IV checked, site marked, risks and benefits discussed, surgical consent, monitors and equipment checked, pre-op evaluation and timeout performed  Epidural Patient position: sitting Prep: DuraPrep and site prepped and draped Patient monitoring: continuous pulse ox and blood pressure Approach: midline Location: L3-L4 Injection technique: LOR air  Needle:  Needle type: Tuohy  Needle gauge: 17 G Needle length: 9 cm and 9 Needle insertion depth: 6 cm Catheter type: closed end flexible Catheter size: 19 Gauge Catheter at skin depth: 11 cm Test dose: negative and Other  Assessment Events: blood not aspirated, no cerebrospinal fluid, injection not painful, no injection resistance, no paresthesia and negative IV test  Additional Notes Patient identified. Risks and benefits discussed including failed block, incomplete  Pain control, post dural puncture headache, nerve damage, paralysis, blood pressure Changes, nausea, vomiting, reactions to medications-both toxic and allergic and post Partum back pain. All questions were answered. Patient expressed understanding and wished to proceed. Sterile technique was used throughout procedure. Epidural site was Dressed with sterile barrier dressing. No paresthesias, signs of intravascular injection Or signs of intrathecal spread were encountered.  Patient was more comfortable after the epidural was dosed. Please see RN's note for documentation of vital signs and FHR which are stable. Reason for block:procedure for pain

## 2023-10-03 NOTE — Discharge Summary (Signed)
 Postpartum Discharge Summary      Patient Name: Tonya [name] DOB: April 27, 1986 MRN: [id number]  Date of admission: 10/02/2023 Delivery date:10/03/2023 Delivering provider: ECKSTAT, MATTHEW M Date of discharge: 10/04/2023  Admitting diagnosis: Morbid obesity (HCC) [E66.01] Intrauterine pregnancy: [redacted]w[redacted]d     Secondary diagnosis:  Principal Problem:   Morbid obesity (HCC)  Additional problems: None    Discharge diagnosis: Term Pregnancy Delivered                                              Post partum procedures: n/a Augmentation: AROM, Pitocin , Cytotec , and IP Foley Complications: None  Hospital course: Induction of Labor With Vaginal Delivery   38 y.o. yo H2E2992 at [redacted]w[redacted]d was admitted to the hospital 10/02/2023 for induction of labor.  Indication for induction: Elective.  Patient had an labor course complicated by 20m40s shoulder dystocia. Membrane Rupture Time/Date: 6:15 AM,10/03/2023  Delivery Method:Vaginal, Spontaneous Operative Delivery:N/A Episiotomy: None Lacerations:  1st degree;Perineal Details of delivery can be found in separate delivery note.  Patient had a postpartum course that was uncomplicated. Patient is discharged home 10/04/23.  Newborn Data: Birth date:10/03/2023 Birth time:10:32 AM Gender:Female Living status:Living Apgars:4 ,9  Weight:3610 g  Magnesium Sulfate received: No BMZ received: No Rhophylac:N/A MMR:N/A T-DaP: Declined prenatally Flu: No RSV Vaccine received: No Transfusion:No  Immunizations received: Immunization History  Administered Date(s) Administered   Tdap 10/16/2015, 07/10/2023   Physical exam  Vitals:   10/03/23 1810 10/03/23 2230 10/04/23 0221 10/04/23 0506  BP: 118/64 107/74 (!) 98/55 113/67  Pulse: 88 73 81 79  Resp:  16 18 16   Temp:  99 F (37.2 C) 98.6 F (37 C) 98 F (36.7 C)  TempSrc:  Oral Oral Oral  SpO2:  100% 99% 99%  Weight:      Height:       General: alert, cooperative, and no distress Lochia:  appropriate Uterine Fundus: firm Incision: N/A DVT Evaluation: No evidence of DVT seen on physical exam. Labs: Lab Results  Component Value Date   WBC 5.1 10/02/2023   HGB 10.4 (L) 10/02/2023   HCT 31.9 (L) 10/02/2023   MCV 79.6 (L) 10/02/2023   PLT 227 10/02/2023      Latest Ref Rng & Units 10/20/2017    1:30 AM  CMP  Glucose 65 - 99 mg/dL 83   BUN 6 - 20 mg/dL 11   Creatinine 9.55 - 1.00 mg/dL 9.37   Sodium 864 - 854 mmol/L 133   Potassium 3.5 - 5.1 mmol/L 4.1   Chloride 101 - 111 mmol/L 106   CO2 22 - 32 mmol/L 19   Calcium  8.9 - 10.3 mg/dL 8.6   Total Protein 6.5 - 8.1 g/dL 7.1   Total Bilirubin 0.3 - 1.2 mg/dL 0.7   Alkaline Phos 38 - 126 U/L 190   AST 15 - 41 U/L 22   ALT 14 - 54 U/L 14    Edinburgh Score:    10/03/2023    8:32 PM  Edinburgh Postnatal Depression Scale Screening Tool  I have been able to laugh and see the funny side of things. 0  I have looked forward with enjoyment to things. 0  I have blamed myself unnecessarily when things went wrong. 2  I have been anxious or worried for no good reason. 0  I have felt scared or panicky for no good reason. 0  Things have been getting on top of me. 1  I have been so unhappy that I have had difficulty sleeping. 1  I have felt sad or miserable. 0  I have been so unhappy that I have been crying. 0  The thought of harming myself has occurred to me. 0  Edinburgh Postnatal Depression Scale Total 4   Edinburgh Postnatal Depression Scale Total: 4   After visit meds:  Allergies as of 10/04/2023       Reactions   Pork-derived Products Other (See Comments)   Cultural preference        Medication List     STOP taking these medications    aspirin  EC 81 MG tablet       TAKE these medications    acetaminophen  325 MG tablet Commonly known as: Tylenol  Take 2 tablets (650 mg total) by mouth every 4 (four) hours as needed for mild pain (for pain scale < 4).   ferrous gluconate  324 MG tablet Commonly known  as: FERGON Take 1 tablet (324 mg total) by mouth daily with breakfast.   ibuprofen  600 MG tablet Commonly known as: ADVIL  Take 1 tablet (600 mg total) by mouth every 6 (six) hours.   levothyroxine  75 MCG tablet Commonly known as: Synthroid  Take 1 tablet (75 mcg total) by mouth daily before breakfast.   multivitamin-prenatal 27-0.8 MG Tabs tablet Take 1 tablet by mouth daily at 12 noon.   pantoprazole  40 MG tablet Commonly known as: PROTONIX  Take 1 tablet (40 mg total) by mouth daily.   senna-docusate 8.6-50 MG tablet Commonly known as: Senokot-S Take 2 tablets by mouth 2 (two) times daily as needed for mild constipation.      Discharge home in stable condition Infant Feeding: Breast Infant Disposition:home with mother Discharge instruction: per After Visit Summary and Postpartum booklet. Activity: Advance as tolerated. Pelvic rest for 6 weeks.  Diet: routine diet  Future Appointments: Future Appointments  Date Time Provider Department Center  10/08/2023  3:50 PM Delores Nidia CROME, FNP [facility] None   Follow up Visit: Message sent to [facility] on 10/04/23 by Cornell Finder, CNM Please schedule this patient for a In person postpartum visit in 4 weeks with the following provider:  any female provider . Additional Postpartum F/U: None   Low risk pregnancy complicated by:  None Delivery mode:  Vaginal, Spontaneous Anticipated Birth Control:   Declined   10/04/2023 Augustin JAYSON Slade, MD

## 2023-10-03 NOTE — Lactation Note (Signed)
 This note was copied from a baby's chart. Lactation Consultation Note  Patient Name: Tonya [name] Date: 10/03/2023 Age:40 hours Reason for consult: Initial assessment;Term: See MR-AMA, Hypothyroid  [place] interpreter used [place] [phone]  Per MOB, infant is latching well, infant is BF for 15 minutes most feedings. Infant recently BF, MOB concerns she does not have colostrum, LC discussed hand expression and MOB easily expressed 8 mls that spoon fed to infant and she realized she does have colostrum that is enough for infant. MOB will continue to BF infant by cues, on demand, every 2-3 hours. MOB knows to call if she has any questions or concerns.  Maternal Data    Feeding Mother's Current Feeding Choice: Breast Milk  LATCH Score  LC did not observe latch at this time.                   Lactation Tools Discussed/Used    Interventions Interventions: Breast feeding basics reviewed;Hand express;Education  Discharge    Consult Status Consult Status: Follow-up Date: 10/04/23 Follow-up type: In-patient    Tonya [name] 10/03/2023, 10:57 PM

## 2023-10-03 NOTE — Anesthesia Preprocedure Evaluation (Addendum)
 Anesthesia Evaluation  Patient identified by MRN, date of birth, ID band Patient awake    Reviewed: Allergy & Precautions, Patient's Chart, lab work & pertinent test results  Airway Mallampati: III  TM Distance: >3 FB     Dental no notable dental hx.    Pulmonary neg pulmonary ROS   Pulmonary exam normal        Cardiovascular negative cardio ROS Normal cardiovascular exam Rhythm:Regular     Neuro/Psych negative neurological ROS  negative psych ROS   GI/Hepatic Neg liver ROS,GERD  ,,  Endo/Other  Hypothyroidism  Class 3 obesity  Renal/GU negative Renal ROS  negative genitourinary   Musculoskeletal negative musculoskeletal ROS (+)    Abdominal  (+) + obese  Peds  Hematology  (+) Blood dyscrasia, anemia   Anesthesia Other Findings   Reproductive/Obstetrics (+) Pregnancy Grand multiparity                              Anesthesia Physical Anesthesia Plan  ASA: 3  Anesthesia Plan: Epidural   Post-op Pain Management: Minimal or no pain anticipated   Induction: Intravenous  PONV Risk Score and Plan:   Airway Management Planned: Natural Airway  Additional Equipment: None  Intra-op Plan:   Post-operative Plan:   Informed Consent: I have reviewed the patients History and Physical, chart, labs and discussed the procedure including the risks, benefits and alternatives for the proposed anesthesia with the patient or authorized representative who has indicated his/her understanding and acceptance.     Interpreter used for interview  Plan Discussed with: Anesthesiologist  Anesthesia Plan Comments:         Anesthesia Quick Evaluation

## 2023-10-03 NOTE — Progress Notes (Signed)
 LABOR PROGRESS NOTE  Patient Name: Tonya [name], female   DOB: 10/22/1985, 38 y.o.  MRN: [id number]  Patient comfortable s/p epidural. She is amenable to AROM. R/B/A of AROM discussed with patient, and verbal consent obtained. Babe's head found to be well-applied. Obtained  large amount of  clear fluid. Bloody show. Mom and babe tolerated well. Cat I. Anticipate SVD.  Almarie CHRISTELLA Moats, MD

## 2023-10-04 ENCOUNTER — Encounter: Payer: Self-pay | Admitting: Obstetrics and Gynecology

## 2023-10-04 ENCOUNTER — Other Ambulatory Visit (HOSPITAL_COMMUNITY): Payer: Self-pay

## 2023-10-04 MED ORDER — IBUPROFEN 600 MG PO TABS
600.0000 mg | ORAL_TABLET | Freq: Four times a day (QID) | ORAL | 0 refills | Status: AC
  Filled 2023-10-04: qty 30, 8d supply, fill #0

## 2023-10-04 MED ORDER — SENNOSIDES-DOCUSATE SODIUM 8.6-50 MG PO TABS
2.0000 | ORAL_TABLET | Freq: Two times a day (BID) | ORAL | 0 refills | Status: AC | PRN
  Filled 2023-10-04: qty 60, 15d supply, fill #0

## 2023-10-04 NOTE — Progress Notes (Signed)
 POSTPARTUM PROGRESS NOTE  Post Partum Day 1  Subjective:  Tonya [name] is a 38 y.o. H2E2992 s/p SVD at [redacted]w[redacted]d.  She reports she is doing well. No acute events overnight. She denies any problems with ambulating, voiding or po intake. Denies nausea or vomiting.  Pain is well controlled.  Lochia is < menses.  Objective: Blood pressure 113/67, pulse 79, temperature 98 F (36.7 C), temperature source Oral, resp. rate 16, height 5' 2 (1.575 m), weight 109 kg, last menstrual period 12/30/2022, SpO2 99%, unknown if currently breastfeeding.  Physical Exam:  General: alert, cooperative and no distress Chest: no respiratory distress Heart:regular rate, distal pulses intact Uterine Fundus: firm, appropriately tender DVT Evaluation: No calf swelling or tenderness Extremities: trace edema Skin: warm, dry  Recent Labs    10/02/23 1651  HGB 10.4*  HCT 31.9*    Assessment/Plan: Tonya [name] is a 38 y.o. H2E2992 s/p SVD at [redacted]w[redacted]d   PPD#1 - Doing well  Routine postpartum care Contraception: none Feeding: breast Dispo: Plan for discharge today per patient request.   LOS: 2 days   Augustin JAYSON Slade, MD OB Fellow  10/04/2023, 7:16 AM

## 2023-10-04 NOTE — Anesthesia Postprocedure Evaluation (Signed)
 Anesthesia Post Note  Patient: Tonya [name]  Procedure(s) Performed: AN AD HOC LABOR EPIDURAL     Patient location during evaluation: Mother Baby Anesthesia Type: Epidural Level of consciousness: awake and alert Pain management: pain level controlled Vital Signs Assessment: post-procedure vital signs reviewed and stable Respiratory status: spontaneous breathing, nonlabored ventilation and respiratory function stable Cardiovascular status: stable Postop Assessment: no headache, no backache and epidural receding Anesthetic complications: no   No notable events documented.  Last Vitals:  Vitals:   10/04/23 0221 10/04/23 0506  BP: (!) 98/55 113/67  Pulse: 81 79  Resp: 18 16  Temp: 37 C 36.7 C  SpO2: 99% 99%    Last Pain:  Vitals:   10/04/23 0506  TempSrc: Oral  PainSc:    Pain Goal:                   Annayah Worthley

## 2023-10-08 ENCOUNTER — Encounter: Payer: Medicaid Other | Admitting: Obstetrics and Gynecology

## 2023-10-11 ENCOUNTER — Telehealth (HOSPITAL_COMMUNITY): Payer: Self-pay

## 2023-10-11 NOTE — Telephone Encounter (Signed)
10/11/2023 0934  Name: Terilynn Buresh MRN: [id number] DOB: 1985-12-06  Reason for Call:  Transition of Care Hospital Discharge Call  Contact Status: Patient Contact Status: Message  Language assistant needed: Interpreter Mode: Telephonic Interpreter Interpreter Name: [phone] Interpreter Phone Number - If applicable: [phone]        Follow-Up Questions:    Inocente Salles Postnatal Depression Scale:  In the Past 7 Days:    PHQ2-9 Depression Scale:     Discharge Follow-up:    Post-discharge interventions: NA  Signature  Signe Colt

## 2023-10-13 ENCOUNTER — Encounter (HOSPITAL_COMMUNITY): Payer: Medicaid Other

## 2023-11-06 ENCOUNTER — Ambulatory Visit: Payer: Medicaid Other | Admitting: Physician Assistant

## 2024-04-09 ENCOUNTER — Other Ambulatory Visit: Payer: Self-pay
